# Patient Record
Sex: Male | Born: 1970
Health system: Southern US, Community
[De-identification: ages and names within clinical notes are randomized; demographics above are authoritative.]

## PROBLEM LIST (undated history)

## (undated) DIAGNOSIS — I10 Essential (primary) hypertension: Secondary | ICD-10-CM

## (undated) HISTORY — DX: Essential (primary) hypertension: I10

## (undated) HISTORY — PX: HERNIA REPAIR: SHX51

---
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
## 1998-05-02 ENCOUNTER — Emergency Department (HOSPITAL_COMMUNITY): Admission: EM | Admit: 1998-05-02 | Discharge: 1998-05-02 | Payer: Self-pay | Admitting: Emergency Medicine

## 1998-11-11 ENCOUNTER — Encounter: Payer: Self-pay | Admitting: Emergency Medicine

## 1998-11-11 ENCOUNTER — Emergency Department (HOSPITAL_COMMUNITY): Admission: EM | Admit: 1998-11-11 | Discharge: 1998-11-11 | Payer: Self-pay | Admitting: Emergency Medicine

## 1998-11-12 ENCOUNTER — Encounter: Payer: Self-pay | Admitting: Orthopedic Surgery

## 2001-09-20 HISTORY — PX: INGUINAL HERNIA REPAIR: SHX194

## 2020-01-30 ENCOUNTER — Ambulatory Visit: Payer: Self-pay | Admitting: Family Medicine

## 2020-02-01 ENCOUNTER — Other Ambulatory Visit: Payer: Self-pay

## 2020-02-01 ENCOUNTER — Encounter: Payer: Self-pay | Admitting: Family Medicine

## 2020-02-01 ENCOUNTER — Other Ambulatory Visit: Payer: Self-pay | Admitting: Family Medicine

## 2020-02-01 ENCOUNTER — Ambulatory Visit: Payer: Self-pay | Admitting: Family Medicine

## 2020-02-01 VITALS — BP 162/100 | HR 57 | Temp 97.8°F | Resp 16 | Ht 73.0 in | Wt 216.4 lb

## 2020-02-01 DIAGNOSIS — Z7689 Persons encountering health services in other specified circumstances: Secondary | ICD-10-CM

## 2020-02-01 DIAGNOSIS — R7309 Other abnormal glucose: Secondary | ICD-10-CM

## 2020-02-01 DIAGNOSIS — I10 Essential (primary) hypertension: Secondary | ICD-10-CM

## 2020-02-01 DIAGNOSIS — N529 Male erectile dysfunction, unspecified: Secondary | ICD-10-CM | POA: Insufficient documentation

## 2020-02-01 DIAGNOSIS — Z125 Encounter for screening for malignant neoplasm of prostate: Secondary | ICD-10-CM

## 2020-02-01 MED ORDER — TRIAMTERENE-HCTZ 37.5-25 MG PO TABS: 1.0000 | ORAL_TABLET | Freq: Every day | ORAL | 1 refills | Status: DC

## 2020-02-01 MED ORDER — SILDENAFIL CITRATE 20 MG PO TABS: ORAL_TABLET | ORAL | 2 refills | Status: DC

## 2020-02-01 MED ORDER — AMLODIPINE BESYLATE 10 MG PO TABS: 10.0000 mg | ORAL_TABLET | Freq: Every day | ORAL | 1 refills | Status: DC

## 2020-02-01 NOTE — Patient Instructions (Addendum)
Thank you for coming to the office today.  For Blood Pressure - Go ahead and get a BP cuff, check daily for now, more often if higher readings, eventually can space it out to 2-3 times a week when more comfortable. - Stay seated for 5+ minutes, arm elevated  Limit caffeine, coffee, keep improving exercise cardiovascular as discussed.   Limit water intake  For erection function - Try the Sildenafil 20mg  - dose from 1 pill up to 5 pills max. 1 dose per day, 30 min prior. - If not satisfied we can order the brand name - OR change the type of med such as Cialis if need.   DUE for FASTING BLOOD WORK (no food or drink after midnight before the lab appointment, only water or coffee without cream/sugar on the morning of)  SCHEDULE "Lab Only" visit in the morning at the clinic for lab draw in 2 WEEKS   - Make sure Lab Only appointment is at about 1 week before your next appointment, so that results will be available  For Lab Results, once available within 2-3 days of blood draw, you can can log in to MyChart online to view your results and a brief explanation. Also, we can discuss results at next follow-up visit.   Please schedule a Follow-up Appointment to: Return in about 3 weeks (around 02/22/2020) for Follow-up HTN.  If you have any other questions or concerns, please feel free to call the office or send a message through [place]. You may also schedule an earlier appointment if necessary.  Additionally, you may be receiving a survey about your experience at our office within a few days to 1 week by e-mail or mail. We value your feedback.  Nobie Putnam, DO [place]

## 2020-02-01 NOTE — Assessment & Plan Note (Addendum)
Consistent with likely multifactorial ED - can be BP, medication, history of alcohol, age No history of DM by report on past results - no prior trial on PDE5 inhibitors  Plan: 1. Trial on generic Sildenafil 20mg  tabs, take 2-5 tabs about 30 min prior to sexual activity, refills provided, goodrx 2. Follow-up as needed - he is very interested in brand name and willing to pay if needed, or consider Cialis or other options.

## 2020-02-01 NOTE — Progress Notes (Signed)
Subjective:    Patient ID: Nathan Wade, male    DOB: 03-16-1971, 49 y.o.   MRN: [id number]  Nathan Wade is a 49 y.o. male presenting on 02/01/2020 for Establish Care (ED, HTN)  Establishing care. No insurance currently.  HPI   Additional social/background history Works as a Glass blower/designer History of darker past with alcohol, AA for 7 years off alcohol now doing well. Out of prison 2 weeks ago. Cash pay today. He was on work release and was able keep job community. High stressful situation now leaving prison but he is working on improving. He was having BP monitored once a week recently. Lowest BP in past few months 138/88. Highest 150s/90s  CHRONIC HTN: Reports he has run out of Amlodipine now. He feels overall has some reduced energy with taking BP medications. He has some other side effect with other meds Lisinopril with cough. Had issue with MaxzideHCTZ did well for BP but had low sodium Family history with low BP Current Meds - Olmesartan 40mg  takes half for 20 - has side effect with burning pain in stomach on med then none if off med. Out of Amlodipine today   Reports good compliance, took some meds today Lifestyle: - Diet: balanced, does drink up to 1 gal of water most days - Exercise: his exercise has been limited previously in prison, he was working out but limited activity - Drinks coffee frequently, goal to reduce Denies CP, dyspnea, HA, edema, dizziness / lightheadedness  Erectile Dysfunction: Worsening over past 1 year, initially it was problem with maintaining erection. Now has worsening problem initiating and maintaining. Inadequate erection. Can wake up with partial erection.  Says worse with BP not as well controlled. He has good sexual desire and interest, good sexual attraction to fiancee.  Health Maintenance: Requests annual check up and screening and all labs testing. He is awaiting insurance for further screening.  Depression screen PHQ 2/9 02/01/2020    Decreased Interest 0  Down, Depressed, Hopeless 0  PHQ - 2 Score 0    History reviewed. No pertinent past medical history. Past Surgical History:  Procedure Laterality Date  . BACK SURGERY  1990   Social History   Socioeconomic History  . Marital status: Single    Spouse name: Not on file  . Number of children: Not on file  . Years of education: [employer]  . Highest education level: High school graduate  Occupational History  . Occupation: Glass blower/designer  Tobacco Use  . Smoking status: Never Smoker  . Smokeless tobacco: Never Used  Substance and Sexual Activity  . Alcohol use: Not Currently  . Drug use: Never  . Sexual activity: Not on file  Other Topics Concern  . Not on file  Social History Narrative  . Not on file   Social Determinants of Health   Financial Resource Strain:   . Difficulty of Paying Living Expenses:   Food Insecurity:   . Worried About Charity fundraiser in the Last Year:   . Arboriculturist in the Last Year:   Transportation Needs:   . Film/video editor (Medical):   Marland Kitchen Lack of Transportation (Non-Medical):   Physical Activity:   . Days of Exercise per Week:   . Minutes of Exercise per Session:   Stress:   . Feeling of Stress :   Social Connections:   . Frequency of Communication with Friends and Family:   . Frequency of Social Gatherings with Friends  and Family:   . Attends Religious Services:   . Active Member of Clubs or Organizations:   . Attends Archivist Meetings:   Marland Kitchen Marital Status:   Intimate Partner Violence:   . Fear of Current or Ex-Partner:   . Emotionally Abused:   Marland Kitchen Physically Abused:   . Sexually Abused:    Family History  Problem Relation Age of Onset  . Heart attack Mother    Current Outpatient Medications on File Prior to Visit  Medication Sig  . aspirin EC 81 MG tablet Take 81 mg by mouth daily.  Marland Kitchen atorvastatin (LIPITOR) 10 MG tablet Take 10 mg by mouth daily.   No current  facility-administered medications on file prior to visit.    Review of Systems Per HPI unless specifically indicated above      Objective:    BP (!) 162/100 (BP Location: Left Arm, Cuff Size: Normal)   Pulse (!) 57   Temp 97.8 F (36.6 C) (Temporal)   Resp 16   Ht 6\' 1"  (1.854 m)   Wt 216 lb 6.4 oz (98.2 kg)   BMI 28.55 kg/m   Wt Readings from Last 3 Encounters:  02/01/20 216 lb 6.4 oz (98.2 kg)    Physical Exam Vitals and nursing note reviewed.  Constitutional:      General: He is not in acute distress.    Appearance: He is well-developed. He is not diaphoretic.     Comments: Well-appearing, comfortable, cooperative, muscular build  HENT:     Head: Normocephalic and atraumatic.  Eyes:     General:        Right eye: No discharge.        Left eye: No discharge.     Conjunctiva/sclera: Conjunctivae normal.  Neck:     Thyroid: No thyromegaly.  Cardiovascular:     Rate and Rhythm: Normal rate and regular rhythm.     Heart sounds: Normal heart sounds. No murmur.  Pulmonary:     Effort: Pulmonary effort is normal. No respiratory distress.     Breath sounds: Normal breath sounds. No wheezing or rales.  Musculoskeletal:        General: Normal range of motion.     Cervical back: Normal range of motion and neck supple.  Lymphadenopathy:     Cervical: No cervical adenopathy.  Skin:    General: Skin is warm and dry.     Findings: No erythema or rash.  Neurological:     Mental Status: He is alert and oriented to person, place, and time.  Psychiatric:        Behavior: Behavior normal.     Comments: Well groomed, good eye contact, normal speech and thoughts       No results found for this or any previous visit.    Assessment & Plan:   Problem List Items Addressed This Visit    Essential hypertension - Primary    Moderately elevated BP today on initial reading and manual reading at end of visit Off Amlodipine currently ran out. Mixed results on Benicar some side  effect - Home BP readings limited but now can get cuff and start checking  No known complications - but history of ED related to BP    Plan:  1. Stop Benicar 2. Start back on Maxzide 37.5-25mg  daily (Triamterene, HCTZ) daily, and Amlodipine 10mg  daily - ordered 90 day. Discussed his history of hyponatremia on maxzide - he was drinking >1 gal water per day, advised to reduce  to 40-80 oz per day for now and we will monitor chemistry in 2 weeks 2. Encourage improved lifestyle - low sodium diet, regular exercise, reduce stress, limit caffeine coffee 3. Start monitor BP outside office, bring readings to next visit, if persistently >140/90 or new symptoms notify office sooner 4. Follow-up in 3 weeks       Relevant Medications   atorvastatin (LIPITOR) 10 MG tablet   aspirin EC 81 MG tablet   triamterene-hydrochlorothiazide (MAXZIDE-25) 37.5-25 MG tablet   amLODipine (NORVASC) 10 MG tablet   sildenafil (REVATIO) 20 MG tablet   Erectile dysfunction    Consistent with likely multifactorial ED - can be BP, medication, history of alcohol, age No history of DM by report on past results - no prior trial on PDE5 inhibitors  Plan: 1. Trial on generic Sildenafil 20mg  tabs, take 2-5 tabs about 30 min prior to sexual activity, refills provided, goodrx 2. Follow-up as needed - he is very interested in brand name and willing to pay if needed, or consider Cialis or other options.      Relevant Medications   sildenafil (REVATIO) 20 MG tablet    Other Visit Diagnoses    Encounter to establish care with new doctor          Meds ordered this encounter  Medications  . triamterene-hydrochlorothiazide (MAXZIDE-25) 37.5-25 MG tablet    Sig: Take 1 tablet by mouth daily.    Dispense:  90 tablet    Refill:  1  . amLODipine (NORVASC) 10 MG tablet    Sig: Take 1 tablet (10 mg total) by mouth daily.    Dispense:  90 tablet    Refill:  1  . sildenafil (REVATIO) 20 MG tablet    Sig: Take 1-5 pills about  30 min prior to sex. Start with 1-2 and increase as needed.    Dispense:  100 tablet    Refill:  2      Follow up plan: Return in about 3 weeks (around 02/22/2020) for Follow-up HTN.  Nobie Putnam, [facility] 02/01/2020, 3:25 PM

## 2020-02-01 NOTE — Assessment & Plan Note (Addendum)
Moderately elevated BP today on initial reading and manual reading at end of visit Off Amlodipine currently ran out. Mixed results on Benicar some side effect - Home BP readings limited but now can get cuff and start checking  No known complications - but history of ED related to BP    Plan:  1. Stop Benicar 2. Start back on Maxzide 37.5-25mg  daily (Triamterene, HCTZ) daily, and Amlodipine 10mg  daily - ordered 90 day. Discussed his history of hyponatremia on maxzide - he was drinking >1 gal water per day, advised to reduce to 40-80 oz per day for now and we will monitor chemistry in 2 weeks 2. Encourage improved lifestyle - low sodium diet, regular exercise, reduce stress, limit caffeine coffee 3. Start monitor BP outside office, bring readings to next visit, if persistently >140/90 or new symptoms notify office sooner 4. Follow-up in 3 weeks

## 2020-02-15 ENCOUNTER — Telehealth: Payer: Self-pay

## 2020-02-15 ENCOUNTER — Other Ambulatory Visit: Payer: Self-pay

## 2020-02-15 DIAGNOSIS — N529 Male erectile dysfunction, unspecified: Secondary | ICD-10-CM

## 2020-02-15 DIAGNOSIS — R7309 Other abnormal glucose: Secondary | ICD-10-CM

## 2020-02-15 DIAGNOSIS — E785 Hyperlipidemia, unspecified: Secondary | ICD-10-CM | POA: Diagnosis not present

## 2020-02-15 DIAGNOSIS — I10 Essential (primary) hypertension: Secondary | ICD-10-CM

## 2020-02-15 DIAGNOSIS — Z125 Encounter for screening for malignant neoplasm of prostate: Secondary | ICD-10-CM | POA: Diagnosis not present

## 2020-02-15 MED ORDER — VIAGRA 100 MG PO TABS: 100.0000 mg | ORAL_TABLET | Freq: Every day | ORAL | 0 refills | Status: DC | PRN

## 2020-02-15 NOTE — Telephone Encounter (Signed)
Patient came by to got blood work and requested for to send a message.  He reported that you told him to get back with him if he wanted to try the name brand of Viagra.  He would like this to be sent to pharm in chart.

## 2020-02-15 NOTE — Telephone Encounter (Signed)
PEC transferred the phone call inform the patient as per Dr. Raliegh Ip, he still wants to try out brand Vs generic, aware of price and wants five pills of 100 mg send to the pharmacy.

## 2020-02-15 NOTE — Telephone Encounter (Signed)
Patient called back. Confirmed telephone number. Patient is currently at work with machinery that's why he missed call. Patient stats he will try to be available around 3:30pm to answer the phone to clarify info.

## 2020-02-15 NOTE — Telephone Encounter (Signed)
Left message for patient to call back  

## 2020-02-15 NOTE — Telephone Encounter (Signed)
Attempted to call patient.  We discussed this extensively on 5/14.  He was not convinced the generic medication would work as good as brand name.  I advised him the extremely high cost of brand name and the result should not be much different.  Could you call him back to clarify a few things - I need to know the following information  Brand name Viagra medicine rx  Would he prefer 50mg  or 100mg  tablet  If he takes the 50mg  - he may double the dose up to 2 pills or 100mg  if needed, that is the absolute max dose in 24 hours.  How many pills would he like me to send in?  Typically I would advise 10 pills or less.  10 pills of 50mg  or 100mg  = either one, is going to be $600-700, using a goodrx.com coupon at [place], [facility] 02/15/2020, 11:11 AM

## 2020-02-16 LAB — CBC WITH DIFFERENTIAL/PLATELET
Absolute Monocytes: 524 cells/uL (ref 200–950)
Basophils Absolute: 51 cells/uL (ref 0–200)
Basophils Relative: 1.1 %
Eosinophils Absolute: 299 cells/uL (ref 15–500)
Eosinophils Relative: 6.5 %
HCT: 40.5 % (ref 38.5–50.0)
Hemoglobin: 14 g/dL (ref 13.2–17.1)
Lymphs Abs: 892 cells/uL (ref 850–3900)
MCH: 31.7 pg (ref 27.0–33.0)
MCHC: 34.6 g/dL (ref 32.0–36.0)
MCV: 91.6 fL (ref 80.0–100.0)
MPV: 8.2 fL (ref 7.5–12.5)
Monocytes Relative: 11.4 %
Neutro Abs: 2834 cells/uL (ref 1500–7800)
Neutrophils Relative %: 61.6 %
Platelets: 239 10*3/uL (ref 140–400)
RBC: 4.42 10*6/uL (ref 4.20–5.80)
RDW: 12.4 % (ref 11.0–15.0)
Total Lymphocyte: 19.4 %
WBC: 4.6 10*3/uL (ref 3.8–10.8)

## 2020-02-16 LAB — COMPLETE METABOLIC PANEL WITH GFR
AG Ratio: 2.1 (calc) (ref 1.0–2.5)
ALT: 22 U/L (ref 9–46)
AST: 27 U/L (ref 10–40)
Albumin: 4.5 g/dL (ref 3.6–5.1)
Alkaline phosphatase (APISO): 50 U/L (ref 36–130)
BUN: 20 mg/dL (ref 7–25)
CO2: 24 mmol/L (ref 20–32)
Calcium: 9.1 mg/dL (ref 8.6–10.3)
Chloride: 95 mmol/L — ABNORMAL LOW (ref 98–110)
Creat: 0.96 mg/dL (ref 0.60–1.35)
GFR, Est African American: 107 mL/min/{1.73_m2} (ref >=60)
GFR, Est Non African American: 92 mL/min/{1.73_m2} (ref >=60)
Globulin: 2.1 g/dL (calc) (ref 1.9–3.7)
Glucose, Bld: 97 mg/dL (ref 65–99)
Potassium: 3.7 mmol/L (ref 3.5–5.3)
Sodium: 129 mmol/L — ABNORMAL LOW (ref 135–146)
Total Bilirubin: 0.7 mg/dL (ref 0.2–1.2)
Total Protein: 6.6 g/dL (ref 6.1–8.1)

## 2020-02-16 LAB — HEMOGLOBIN A1C
Hgb A1c MFr Bld: 5.1 % of total Hgb (ref <5.7)
Mean Plasma Glucose: 100 (calc)
eAG (mmol/L): 5.5 (calc)

## 2020-02-16 LAB — LIPID PANEL
Cholesterol: 139 mg/dL (ref <200)
HDL: 66 mg/dL (ref >=40)
LDL Cholesterol (Calc): 59 mg/dL (calc)
Non-HDL Cholesterol (Calc): 73 mg/dL (calc) (ref <130)
Total CHOL/HDL Ratio: 2.1 (calc) (ref <5.0)
Triglycerides: 59 mg/dL (ref <150)

## 2020-02-16 LAB — TSH: TSH: 1.82 mIU/L (ref 0.40–4.50)

## 2020-02-16 LAB — PSA: PSA: 0.9 ng/mL (ref <=4.0)

## 2020-02-22 ENCOUNTER — Other Ambulatory Visit: Payer: Self-pay

## 2020-02-22 ENCOUNTER — Encounter: Payer: Self-pay | Admitting: Family Medicine

## 2020-02-22 ENCOUNTER — Ambulatory Visit (INDEPENDENT_AMBULATORY_CARE_PROVIDER_SITE_OTHER): Payer: BC Managed Care – PPO | Admitting: Family Medicine

## 2020-02-22 VITALS — BP 144/85 | HR 61 | Temp 97.5°F | Resp 16 | Ht 73.0 in | Wt 214.6 lb

## 2020-02-22 DIAGNOSIS — Z1211 Encounter for screening for malignant neoplasm of colon: Secondary | ICD-10-CM

## 2020-02-22 DIAGNOSIS — R7989 Other specified abnormal findings of blood chemistry: Secondary | ICD-10-CM

## 2020-02-22 DIAGNOSIS — E7800 Pure hypercholesterolemia, unspecified: Secondary | ICD-10-CM

## 2020-02-22 DIAGNOSIS — I10 Essential (primary) hypertension: Secondary | ICD-10-CM

## 2020-02-22 DIAGNOSIS — N529 Male erectile dysfunction, unspecified: Secondary | ICD-10-CM

## 2020-02-22 DIAGNOSIS — R252 Cramp and spasm: Secondary | ICD-10-CM | POA: Diagnosis not present

## 2020-02-22 DIAGNOSIS — Z87891 Personal history of nicotine dependence: Secondary | ICD-10-CM

## 2020-02-22 DIAGNOSIS — E871 Hypo-osmolality and hyponatremia: Secondary | ICD-10-CM

## 2020-02-22 DIAGNOSIS — Z7722 Contact with and (suspected) exposure to environmental tobacco smoke (acute) (chronic): Secondary | ICD-10-CM

## 2020-02-22 MED ORDER — TADALAFIL 20 MG PO TABS: 20.0000 mg | ORAL_TABLET | ORAL | 0 refills | Status: DC | PRN

## 2020-02-22 MED ORDER — ATORVASTATIN CALCIUM 10 MG PO TABS: 10.0000 mg | ORAL_TABLET | Freq: Every day | ORAL | 1 refills | Status: DC

## 2020-02-22 NOTE — Patient Instructions (Addendum)
Thank you for coming to the office today.  Leg cramps - Try spoonful of yellow mustard to relieve leg cramps or try daily to prevent the problem  - OTC natural option is Hyland's Leg Cramps (Dissolving tablet) take as needed for muscle cramps  -----------------------------------  Chest X-ray ordered - you can do walk in whenever you are ready - not during lunch hour. mon - fri  Cialis ordered to [place] sent to [facility]  ---------------------------------------------------------------  LAB required to be 8-9am ONLY for testosterone.  DUE for FASTING BLOOD WORK (no food or drink after midnight before the lab appointment, only water or coffee without cream/sugar on the morning of)  SCHEDULE "Lab Only" visit in the morning at the clinic for lab draw in 2 WEEKS    For Lab Results, once available within 2-3 days of blood draw, you can can log in to MyChart online to view your results and a brief explanation. Also, we can discuss results at next follow-up visit.  Colon Cancer Screening: - For all adults age 74+ routine colon cancer screening is highly recommended.     - Recent guidelines from [place] recommend starting age of 76 - Early detection of colon cancer is important, because often there are no warning signs or symptoms, also if found early usually it can be cured. Late stage is hard to treat.  - If you are not interested in Colonoscopy screening (if done and normal you could be cleared for 5 to 10 years until next due), then Cologuard is an excellent alternative for screening test for Colon Cancer. It is highly sensitive for detecting DNA of colon cancer from even the earliest stages. Also, there is NO bowel prep required. - If Cologuard is NEGATIVE, then it is good for 3 years before next due - If Cologuard is POSITIVE, then it is strongly advised to get a Colonoscopy, which allows the GI doctor to locate the source of the cancer or polyp (even very early  stage) and treat it by removing it. ------------------------- If you would like to proceed with Cologuard (stool DNA test) - FIRST, call your insurance company and tell them you want to check cost of Cologuard tell them CPT Code [phone] (it may be completely covered and you could get for no cost, OR max cost without any coverage is about $600). Also, keep in mind if you do NOT open the kit, and decide not to do the test, you will NOT be charged, you should contact the company if you decide not to do the test. - If you want to proceed, you can notify us (phone message, [place], or at next visit) and we will order it for you. The test kit will be delivered to you house within about 1 week. Follow instructions to collect sample, you may call the company for any help or questions, 24/7 telephone support at [phone].   Please schedule a Follow-up Appointment to: Return in about 2 weeks (around 03/07/2020) for lab only AM.  If you have any other questions or concerns, please feel free to call the office or send a message through [place]. You may also schedule an earlier appointment if necessary.  Additionally, you may be receiving a survey about your experience at our office within a few days to 1 week by e-mail or mail. We value your feedback.  Nobie Putnam, DO [place]

## 2020-02-22 NOTE — Progress Notes (Signed)
Subjective:    Patient ID: Nathan Wade, male    DOB: [birth year], 49 y.o.   MRN: [id number]  Nathan Wade is a 49 y.o. male presenting on 02/22/2020 for Hypertension   HPI    CHRONIC HTN: Last visit 01/2020 resumed HTN therapy He is doing well back on Maxzide and Amlodipine. BP controlled or nearly controlled now Last lab did show some Hyponatremia again, he has adjusted his water intake Reports good compliance, took some meds today Lifestyle: - Diet: balanced, drinks a lot of water still, takes a pinch of salt at times if leg cramp - Exercise: improved exercise now cardiovascular and weight training - Drinks coffee frequently, goal to reduce Denies CP, dyspnea, HA, edema, dizziness / lightheadedness  Erectile Dysfunction: Worsening over past 1 year, initially it was problem with maintaining erection. Now has worsening problem initiating and maintaining. Inadequate erection. Can wake up with partial erection.  Says worse with BP not as well controlled. He has good sexual desire and interest, good sexual attraction to fiancee. - Today he says has tried Sildenafil and Viagra brand same results. Would like to try Cialis.  History of Low T Requesting lab test. Prior history, but no results, never on testosterone therapy rx.  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 No prior screening. Currently asymptomatic. No known family history of colon CA. Due for screening test age 67 - will pursue cologuard   Depression screen [facility] 2/9 02/22/2020 02/01/2020  Decreased Interest 0 0  Down, Depressed, Hopeless 0 0  PHQ - 2 Score 0 0    Social History   Tobacco Use  . Smoking status: Former Smoker    Packs/day: 0.50    Years: 4.00    Pack years: 2.00    Types: Cigarettes    Quit date: 2012    Years since quitting: 9.4  . Smokeless tobacco: Never Used  Substance Use Topics  . Alcohol use: Yes  . Drug use: Never    Review of  Systems Per HPI unless specifically indicated above     Objective:    BP (!) 144/85   Pulse 61   Temp (!) 97.5 F (36.4 C) (Temporal)   Resp 16   Ht 6\' 1"  (1.854 m)   Wt 214 lb 9.6 oz (97.3 kg)   BMI 28.31 kg/m   Wt Readings from Last 3 Encounters:  02/22/20 214 lb 9.6 oz (97.3 kg)  02/01/20 216 lb 6.4 oz (98.2 kg)    Physical Exam Vitals and nursing note reviewed.  Constitutional:      General: He is not in acute distress.    Appearance: He is well-developed. He is not diaphoretic.     Comments: Well-appearing, comfortable, cooperative  HENT:     Head: Normocephalic and atraumatic.  Eyes:     General:        Right eye: No discharge.        Left eye: No discharge.     Conjunctiva/sclera: Conjunctivae normal.  Neck:     Thyroid: No thyromegaly.  Cardiovascular:     Rate and Rhythm: Normal rate and regular rhythm.     Heart sounds: Normal heart sounds. No murmur.  Pulmonary:     Effort: Pulmonary effort is normal. No respiratory distress.     Breath sounds: Normal breath sounds. No wheezing or rales.  Musculoskeletal:        General: Normal range of motion.  Cervical back: Normal range of motion and neck supple.  Lymphadenopathy:     Cervical: No cervical adenopathy.  Skin:    General: Skin is warm and dry.     Findings: No erythema or rash.  Neurological:     Mental Status: He is alert and oriented to person, place, and time.  Psychiatric:        Behavior: Behavior normal.     Comments: Well groomed, good eye contact, normal speech and thoughts    Results for orders placed or performed in visit on 02/15/20  TSH  Result Value Ref Range   TSH 1.82 0.40 - 4.50 mIU/L  PSA  Result Value Ref Range   PSA 0.9 < OR = 4.0 ng/mL  Lipid panel  Result Value Ref Range   Cholesterol 139 <200 mg/dL   HDL 66 > OR = 40 mg/dL   Triglycerides 59 <150 mg/dL   LDL Cholesterol (Calc) 59 mg/dL (calc)   Total CHOL/HDL Ratio 2.1 <5.0 (calc)   Non-HDL Cholesterol (Calc) 73  <130 mg/dL (calc)  COMPLETE METABOLIC PANEL WITH GFR  Result Value Ref Range   Glucose, Bld 97 65 - 99 mg/dL   BUN 20 7 - 25 mg/dL   Creat 0.96 0.60 - 1.35 mg/dL   GFR, Est Non African American 92 > OR = 60 mL/min/1.2m2   GFR, Est African American 107 > OR = 60 mL/min/1.56m2   BUN/Creatinine Ratio NOT APPLICABLE 6 - 22 (calc)   Sodium 129 (L) 135 - 146 mmol/L   Potassium 3.7 3.5 - 5.3 mmol/L   Chloride 95 (L) 98 - 110 mmol/L   CO2 24 20 - 32 mmol/L   Calcium 9.1 8.6 - 10.3 mg/dL   Total Protein 6.6 6.1 - 8.1 g/dL   Albumin 4.5 3.6 - 5.1 g/dL   Globulin 2.1 1.9 - 3.7 g/dL (calc)   AG Ratio 2.1 1.0 - 2.5 (calc)   Total Bilirubin 0.7 0.2 - 1.2 mg/dL   Alkaline phosphatase (APISO) 50 36 - 130 U/L   AST 27 10 - 40 U/L   ALT 22 9 - 46 U/L  CBC with Differential/Platelet  Result Value Ref Range   WBC 4.6 3.8 - 10.8 Thousand/uL   RBC 4.42 4.20 - 5.80 Million/uL   Hemoglobin 14.0 13.2 - 17.1 g/dL   HCT 40.5 38.5 - 50.0 %   MCV 91.6 80.0 - 100.0 fL   MCH 31.7 27.0 - 33.0 pg   MCHC 34.6 32.0 - 36.0 g/dL   RDW 12.4 11.0 - 15.0 %   Platelets 239 140 - 400 Thousand/uL   MPV 8.2 7.5 - 12.5 fL   Neutro Abs 2,834 1,500 - 7,800 cells/uL   Lymphs Abs 892 850 - 3,900 cells/uL   Absolute Monocytes 524 200 - 950 cells/uL   Eosinophils Absolute 299 15 - 500 cells/uL   Basophils Absolute 51 0 - 200 cells/uL   Neutrophils Relative % 61.6 %   Total Lymphocyte 19.4 %   Monocytes Relative 11.4 %   Eosinophils Relative 6.5 %   Basophils Relative 1.1 %  Hemoglobin A1c  Result Value Ref Range   Hgb A1c MFr Bld 5.1 <5.7 % of total Hgb   Mean Plasma Glucose 100 (calc)   eAG (mmol/L) 5.5 (calc)      Assessment & Plan:   Problem List Items Addressed This Visit    Essential hypertension - Primary    Improved HTN currently back on meds No known complications - but  history of ED related to BP    Plan:  1. Continue Maxzide 37.5-25mg  daily (Triamterene, HCTZ) daily, and Amlodipine 10mg  daily -  ordered 90 day. 2. Regarding hyponatremia - mild on lab. He should limit excess water intake and continue with appropriate diet as discussed - Repeat chemistry once more within 2 weeks for Na and also Testosterone 3. Encourage improved lifestyle - low sodium diet, regular exercise, reduce stress, limit caffeine coffee 4. monitor BP outside office, bring readings to next visit, if persistently >140/90 or new symptoms notify office sooner        Relevant Medications   tadalafil (CIALIS) 20 MG tablet   atorvastatin (LIPITOR) 10 MG tablet   Other Relevant Orders   DG Chest 2 View   Basic Metabolic Panel (BMET)   Erectile dysfunction    Consistent with likely multifactorial ED - can be BP, medication, history of alcohol, age No history of DM by report on past results - no prior trial on PDE5 inhibitors  Plan: 1. Equal results on generic Sildenafil vs brand Viagra - he does want to try alternative now, request generic Cialis Tadalafil - will order this at 20mg  tab q 48 hr PRN as a trial, f/u with request on which med preferred - need to know which pharmacy and pill count, likely use generic / goodrx discount  Future may consider refer to Urology if indicated      Relevant Medications   tadalafil (CIALIS) 20 MG tablet   Other Relevant Orders   Testosterone    Other Visit Diagnoses    Leg cramps       Exposure to second hand smoke       Relevant Orders   DG Chest 2 View   Former smoker       Relevant Orders   DG Chest 2 View   Pure hypercholesterolemia       Relevant Medications   tadalafil (CIALIS) 20 MG tablet   atorvastatin (LIPITOR) 10 MG tablet   Screening for colon cancer       Relevant Orders   Cologuard   Hyponatremia       Relevant Orders   Basic Metabolic Panel (BMET)   Low testosterone in male       Relevant Orders   Testosterone      Due for routine colon cancer screening. Never had colonoscopy (not interested), no family history colon cancer. - Discussion  today about recommendations for either Colonoscopy or Cologuard screening, benefits and risks of screening, interested in Cologuard, understands that if positive then recommendation is for diagnostic colonoscopy to follow-up. - Ordered Cologuard today  - Patient advised to contact insurance first to learn cost  #2nd hand smoke / cough He requests CXR evaluation given history of former smoker and 2nd hand smoke and he has occasional cough. He will return for walk in CXR once it is ordered.  #History Low T Prior report concern for low T, no lab result available Known ED Will check testosterone in AM in 1-2 weeks, f/u result, discussed benefit risk of testosterone treatment, if ultimately low and indicated for therapy would refer to Urology. Otherwise he may try OTC supplement testosterone boost if interested  Meds ordered this encounter  Medications  . tadalafil (CIALIS) 20 MG tablet    Sig: Take 1 tablet (20 mg total) by mouth every other day as needed for erectile dysfunction.    Dispense:  30 tablet    Refill:  0  . atorvastatin (LIPITOR)  10 MG tablet    Sig: Take 1 tablet (10 mg total) by mouth daily.    Dispense:  90 tablet    Refill:  1      Follow up plan: Return in about 2 weeks (around 03/07/2020) for lab only AM.  Future labs ordered for 2 weeks BMET Testosterone   Nobie Putnam, DO [facility] 02/22/2020, 3:08 PM

## 2020-02-23 NOTE — Assessment & Plan Note (Signed)
Improved HTN currently back on meds No known complications - but history of ED related to BP    Plan:  1. Continue Maxzide 37.5-25mg  daily (Triamterene, HCTZ) daily, and Amlodipine 10mg  daily - ordered 90 day. 2. Regarding hyponatremia - mild on lab. He should limit excess water intake and continue with appropriate diet as discussed - Repeat chemistry once more within 2 weeks for Na and also Testosterone 3. Encourage improved lifestyle - low sodium diet, regular exercise, reduce stress, limit caffeine coffee 4. monitor BP outside office, bring readings to next visit, if persistently >140/90 or new symptoms notify office sooner

## 2020-02-23 NOTE — Assessment & Plan Note (Signed)
Consistent with likely multifactorial ED - can be BP, medication, history of alcohol, age No history of DM by report on past results - no prior trial on PDE5 inhibitors  Plan: 1. Equal results on generic Sildenafil vs brand Viagra - he does want to try alternative now, request generic Cialis Tadalafil - will order this at 20mg  tab q 48 hr PRN as a trial, f/u with request on which med preferred - need to know which pharmacy and pill count, likely use generic / goodrx discount  Future may consider refer to Urology if indicated

## 2020-02-25 ENCOUNTER — Other Ambulatory Visit: Payer: Self-pay

## 2020-02-25 DIAGNOSIS — E7800 Pure hypercholesterolemia, unspecified: Secondary | ICD-10-CM

## 2020-02-25 MED ORDER — ATORVASTATIN CALCIUM 10 MG PO TABS: 10.0000 mg | ORAL_TABLET | Freq: Every day | ORAL | 1 refills | Status: DC

## 2020-02-25 NOTE — Telephone Encounter (Signed)
Patient called and says his Lipitor went to the wrong pharmacy, it was supposed to go to [facility]. Prescription sent to [facility] as requested.

## 2020-03-06 ENCOUNTER — Ambulatory Visit
Admission: RE | Admit: 2020-03-06 | Discharge: 2020-03-06 | Disposition: A | Discharge status: Home / Self Care | Payer: BC Managed Care – PPO | Source: Ambulatory Visit | Attending: Family Medicine | Admitting: Family Medicine

## 2020-03-06 ENCOUNTER — Other Ambulatory Visit: Payer: BC Managed Care – PPO

## 2020-03-06 ENCOUNTER — Other Ambulatory Visit: Payer: Self-pay

## 2020-03-06 DIAGNOSIS — Z87891 Personal history of nicotine dependence: Secondary | ICD-10-CM | POA: Insufficient documentation

## 2020-03-06 DIAGNOSIS — F17200 Nicotine dependence, unspecified, uncomplicated: Secondary | ICD-10-CM | POA: Diagnosis not present

## 2020-03-06 DIAGNOSIS — E291 Testicular hypofunction: Secondary | ICD-10-CM | POA: Diagnosis not present

## 2020-03-06 DIAGNOSIS — N529 Male erectile dysfunction, unspecified: Secondary | ICD-10-CM

## 2020-03-06 DIAGNOSIS — Z7722 Contact with and (suspected) exposure to environmental tobacco smoke (acute) (chronic): Secondary | ICD-10-CM | POA: Diagnosis not present

## 2020-03-06 DIAGNOSIS — J984 Other disorders of lung: Secondary | ICD-10-CM | POA: Diagnosis not present

## 2020-03-06 DIAGNOSIS — R05 Cough: Secondary | ICD-10-CM | POA: Diagnosis not present

## 2020-03-06 DIAGNOSIS — Z1211 Encounter for screening for malignant neoplasm of colon: Secondary | ICD-10-CM

## 2020-03-06 DIAGNOSIS — R7989 Other specified abnormal findings of blood chemistry: Secondary | ICD-10-CM

## 2020-03-06 DIAGNOSIS — E871 Hypo-osmolality and hyponatremia: Secondary | ICD-10-CM

## 2020-03-06 DIAGNOSIS — I10 Essential (primary) hypertension: Secondary | ICD-10-CM

## 2020-03-06 LAB — BASIC METABOLIC PANEL
BUN: 23 mg/dL (ref 7–25)
CO2: 30 mmol/L (ref 20–32)
Calcium: 9.7 mg/dL (ref 8.6–10.3)
Chloride: 94 mmol/L — ABNORMAL LOW (ref 98–110)
Creat: 0.94 mg/dL (ref 0.60–1.35)
Glucose, Bld: 99 mg/dL (ref 65–99)
Potassium: 3.8 mmol/L (ref 3.5–5.3)
Sodium: 132 mmol/L — ABNORMAL LOW (ref 135–146)

## 2020-03-06 LAB — TESTOSTERONE: Testosterone: 261 ng/dL (ref 250–827)

## 2020-03-25 ENCOUNTER — Ambulatory Visit (INDEPENDENT_AMBULATORY_CARE_PROVIDER_SITE_OTHER): Payer: BC Managed Care – PPO | Admitting: Family Medicine

## 2020-03-25 ENCOUNTER — Other Ambulatory Visit: Payer: Self-pay

## 2020-03-25 ENCOUNTER — Encounter: Payer: Self-pay | Admitting: Family Medicine

## 2020-03-25 VITALS — BP 123/79 | HR 68 | Temp 97.3°F | Resp 16 | Ht 73.0 in | Wt 216.0 lb

## 2020-03-25 DIAGNOSIS — R222 Localized swelling, mass and lump, trunk: Secondary | ICD-10-CM

## 2020-03-25 DIAGNOSIS — J0110 Acute frontal sinusitis, unspecified: Secondary | ICD-10-CM | POA: Diagnosis not present

## 2020-03-25 DIAGNOSIS — K4090 Unilateral inguinal hernia, without obstruction or gangrene, not specified as recurrent: Secondary | ICD-10-CM | POA: Diagnosis not present

## 2020-03-25 MED ORDER — IPRATROPIUM BROMIDE 0.06 % NA SOLN: 2.0000 | Freq: Four times a day (QID) | NASAL | 0 refills | Status: DC

## 2020-03-25 MED ORDER — FLUTICASONE PROPIONATE 50 MCG/ACT NA SUSP: 2.0000 | Freq: Every day | NASAL | 3 refills | Status: DC

## 2020-03-25 MED ORDER — AMOXICILLIN-POT CLAVULANATE 875-125 MG PO TABS: 1.0000 | ORAL_TABLET | Freq: Two times a day (BID) | ORAL | 0 refills | Status: DC

## 2020-03-25 NOTE — Progress Notes (Signed)
Subjective:    Patient ID: Nathan Wade, male    DOB: [birth year], 49 y.o.   MRN: [id number]  Nathan Wade is a 49 y.o. male presenting on 03/25/2020 for Hernia   HPI   Inguinal Hernia, Left Prior history R inguinal, hernia repair in 2002 or 03 in [place], open hernia repair has done well with mesh. Now having worsening symptoms hernia on left inguinal region, gradual problem only more pronounced worsening lately over past few weeks or longer, has increased frequency of hernia bugling and fullness and moving, says it comes out after standing for a few minutes of being active ,then it usually doesn't go back in until end of day if resting. It is not painful but uncomfortable at times. Denies any worsening abdominal pain, nausea vomiting, constipation, cough straining  Additional complaint Knot of Right mid abdomen He feels a lump there more superficial and he can move it with his fingers about an inch approx, seems increasing in size, non tender  Sinusitis chronic sinus congestion, clearing nasal, few times a year OTC sinus decongestant raises his BP Tried OTC Nasocort limited results  ED - cialis 20mg  PRN - successful taking q 2 days if needed   Depression screen [facility] 2/9 02/22/2020 02/01/2020  Decreased Interest 0 0  Down, Depressed, Hopeless 0 0  PHQ - 2 Score 0 0   Past Surgical History:  Procedure Laterality Date  . BACK SURGERY  1990  . INGUINAL HERNIA REPAIR Right 2003   open repair, with mesh      Social History   Tobacco Use  . Smoking status: Former Smoker    Packs/day: 0.50    Years: 4.00    Pack years: 2.00    Types: Cigarettes    Quit date: 2012    Years since quitting: 9.5  . Smokeless tobacco: Never Used  Substance Use Topics  . Alcohol use: Yes  . Drug use: Never    Review of Systems Per HPI unless specifically indicated above     Objective:    BP 123/79   Pulse 68   Temp (!) 97.3 F (36.3 C) (Temporal)   Resp 16   Ht 6\' 1"  (1.854 m)    Wt 216 lb (98 kg)   SpO2 98%   BMI 28.50 kg/m   Wt Readings from Last 3 Encounters:  03/25/20 216 lb (98 kg)  02/22/20 214 lb 9.6 oz (97.3 kg)  02/01/20 216 lb 6.4 oz (98.2 kg)    Physical Exam Vitals and nursing note reviewed.  Constitutional:      General: He is not in acute distress.    Appearance: He is well-developed. He is not diaphoretic.     Comments: Well-appearing, comfortable, cooperative  HENT:     Head: Normocephalic and atraumatic.  Eyes:     General:        Right eye: No discharge.        Left eye: No discharge.     Conjunctiva/sclera: Conjunctivae normal.  Cardiovascular:     Rate and Rhythm: Normal rate.  Pulmonary:     Effort: Pulmonary effort is normal.  Abdominal:     Comments: Left inguinal hernia with provoked herniation on valsalva, non tender.  Right abdomen mid - approach 2 x 1 cm nodular density firm mobile seems more subcutaneous.  Skin:    General: Skin is warm and dry.     Findings: No erythema or rash.  Neurological:     Mental Status:  He is alert and oriented to person, place, and time.  Psychiatric:        Behavior: Behavior normal.     Comments: Well groomed, good eye contact, normal speech and thoughts       Results for orders placed or performed in visit on 03/06/20  Testosterone  Result Value Ref Range   Testosterone 261 250 - 827 ng/dL  Basic Metabolic Panel (BMET)  Result Value Ref Range   Glucose, Bld 99 65 - 99 mg/dL   BUN 23 7 - 25 mg/dL   Creat 0.94 0.60 - 1.35 mg/dL   BUN/Creatinine Ratio NOT APPLICABLE 6 - 22 (calc)   Sodium 132 (L) 135 - 146 mmol/L   Potassium 3.8 3.5 - 5.3 mmol/L   Chloride 94 (L) 98 - 110 mmol/L   CO2 30 20 - 32 mmol/L   Calcium 9.7 8.6 - 10.3 mg/dL      Assessment & Plan:   Problem List Items Addressed This Visit    None    Visit Diagnoses    Left inguinal hernia    -  Primary   Relevant Orders   Ambulatory referral to General Surgery   Subcutaneous nodule of abdominal wall        Relevant Orders   Ambulatory referral to General Surgery   Acute non-recurrent frontal sinusitis       Relevant Medications   loratadine (CLARITIN) 10 MG tablet   amoxicillin-clavulanate (AUGMENTIN) 875-125 MG tablet   ipratropium (ATROVENT) 0.06 % nasal spray   fluticasone (FLONASE) 50 MCG/ACT nasal spray      #L inguinal hernia Consistent with acute on chronic Left inguinal hernia - Seems to be gradually worsening and is reducible without evidence of incarceration. Additionally does not extend into scrotum. - No acute red flag symptoms (without nausea vomiting, bowel obstruction, systemic illness, uncontrolled pain) - History of prior R nguinal hernia repair open incision / mesh repair about 20 years ago  Plan: 1. Referral to General Surgery today, goal to have consultation and anticipate future scheduling surgical repair after his upcoming wedding 04/26/20 2. In meantime - recommend conservative therapy limit provoking activities and avoid heavy lifting, try to rest supine and trendelenburg if bulging, may use topical ice packs, hernia support truss / strap as needed or more regularly if worsening, can use Tylenol PRN 3. Strict return criteria and when to go to hospital ED for more acute evaluation if any significant worsening, constant pain, systemic symptoms, or potential incarceration bulge that does not reduce 4. Follow-up as needed  #Abdominal nodule Referral to Gen Surg as well and they can evaluate this area, seems possible lipoma or subcutaneous nodule.   #Acute sinusitis Clinically with persistent sinus infection >10 days Trial on Augmentin BID 7 day Start Atrovent nasal spray decongestant 2 sprays in each nostril up to 4 times daily for 7 days Start nasal steroid Flonase 2 sprays in each nostril daily for 4-6 weeks, may repeat course seasonally or as needed May continue Claritin  Orders Placed This Encounter  Procedures  . Ambulatory referral to General Surgery     Referral Priority:   Routine    Referral Type:   Surgical    Referral Reason:   Specialty Services Required    Requested Specialty:   General Surgery    Number of Visits Requested:   1     Meds ordered this encounter  Medications  . amoxicillin-clavulanate (AUGMENTIN) 875-125 MG tablet    Sig: Take 1 tablet  by mouth 2 (two) times daily. For 7 days    Dispense:  14 tablet    Refill:  0  . ipratropium (ATROVENT) 0.06 % nasal spray    Sig: Place 2 sprays into both nostrils 4 (four) times daily. For up to 5-7 days then stop.    Dispense:  15 mL    Refill:  0  . fluticasone (FLONASE) 50 MCG/ACT nasal spray    Sig: Place 2 sprays into both nostrils daily. Use for 4-6 weeks then stop and use seasonally or as needed.    Dispense:  16 g    Refill:  3      Follow up plan: Return if symptoms worsen or fail to improve, for hernia.    Nobie Putnam, [facility] 03/25/2020, 4:23 PM

## 2020-03-25 NOTE — Patient Instructions (Addendum)
Thank you for coming to the office today.  For sinuses - go ahead and use Augmentin for 7 days. Can stop OTC decongestant. May continue Claritin longer term.  Start nasal steroid Flonase 2 sprays in each nostril daily for 4-6 weeks, may repeat course seasonally or as needed  Short term only - Start Atrovent nasal spray decongestant 2 sprays in each nostril up to 4 times daily for 7 days  Referral placed - stay tuned. They should call and schedule.  [facility] [address] [place],  [place]  [address] Main: [phone]   Please schedule a Follow-up Appointment to: Return if symptoms worsen or fail to improve, for hernia.  If you have any other questions or concerns, please feel free to call the office or send a message through [facility]. You may also schedule an earlier appointment if necessary.  Additionally, you may be receiving a survey about your experience at our office within a few days to 1 week by e-mail or mail. We value your feedback.  Nobie Putnam, DO [place]

## 2020-03-26 ENCOUNTER — Other Ambulatory Visit: Payer: Self-pay | Admitting: Family Medicine

## 2020-03-26 DIAGNOSIS — J0110 Acute frontal sinusitis, unspecified: Secondary | ICD-10-CM

## 2020-03-26 MED ORDER — AMOXICILLIN-POT CLAVULANATE 875-125 MG PO TABS: 1.0000 | ORAL_TABLET | Freq: Two times a day (BID) | ORAL | 0 refills | Status: DC

## 2020-03-26 MED ORDER — IPRATROPIUM BROMIDE 0.06 % NA SOLN: 2.0000 | Freq: Four times a day (QID) | NASAL | 0 refills | Status: DC

## 2020-03-26 MED ORDER — FLUTICASONE PROPIONATE 50 MCG/ACT NA SUSP: 2.0000 | Freq: Every day | NASAL | 3 refills | Status: DC

## 2020-03-26 NOTE — Telephone Encounter (Signed)
Patient states he spoke with pharmacy and pharmacy does not have medication refill. Patient is asking for office to send again

## 2020-03-26 NOTE — Telephone Encounter (Signed)
Signed orders to [place] 03/26/2020, 4:59 PM

## 2020-03-26 NOTE — Addendum Note (Signed)
Addended by: Olin Hauser on: 03/26/2020 04:59 PM   Modules accepted: Orders

## 2020-03-26 NOTE — Telephone Encounter (Signed)
Pt needs his prescriptions sent to  [facility] [place], [place] Phone:  [phone]  Fax:  [phone]     His insurance will not cover at [facility]  ipratropium (ATROVENT) 0.06 % nasal spray  amoxicillin-clavulanate (AUGMENTIN) 875-125 MG tablet

## 2020-03-26 NOTE — Addendum Note (Signed)
Addended by: Frederich Cha D on: 03/26/2020 04:59 PM   Modules accepted: Orders

## 2020-05-05 ENCOUNTER — Other Ambulatory Visit: Payer: Self-pay

## 2020-05-05 DIAGNOSIS — N529 Male erectile dysfunction, unspecified: Secondary | ICD-10-CM

## 2020-05-06 MED ORDER — TADALAFIL 20 MG PO TABS: 20.0000 mg | ORAL_TABLET | ORAL | 3 refills | Status: DC | PRN

## 2020-05-13 ENCOUNTER — Other Ambulatory Visit: Payer: Self-pay

## 2020-05-13 ENCOUNTER — Encounter: Payer: Self-pay | Admitting: General Surgery

## 2020-05-13 ENCOUNTER — Ambulatory Visit (INDEPENDENT_AMBULATORY_CARE_PROVIDER_SITE_OTHER): Payer: BC Managed Care – PPO | Admitting: General Surgery

## 2020-05-13 VITALS — BP 157/87 | HR 57 | Temp 98.4°F | Resp 12 | Ht 73.0 in | Wt 212.0 lb

## 2020-05-13 DIAGNOSIS — K4090 Unilateral inguinal hernia, without obstruction or gangrene, not specified as recurrent: Secondary | ICD-10-CM | POA: Diagnosis not present

## 2020-05-13 NOTE — Progress Notes (Signed)
Patient ID: Nathan Wade, male   DOB: 03/19/1971, 49 y.o.   MRN: [id number]  Chief Complaint  Patient presents with  . New Patient (Initial Visit)    Left inguinal hernia and abdominal wall mass    HPI Nathan Wade is a 49 y.o. male.  He has been referred by his primary care provider, Dr. Parks Ranger, for evaluation of a left inguinal hernia and a small right-sided abdominal mass.  He states that he first noticed the hernia about 10 to 12 months ago.  It previously did not bulge much, but over the ensuing time, he has noticed an increase in the bulging area.  He notices it most when he is lifting weights or if he coughs.  He says that when he first noticed it, there was a burning sensation in his right groin, that has since dissipated.  He now notices an occasional tingling sensation in that area when the hernia is particularly prominent.  He denies any nausea or vomiting.  No alterations in bowel function or difficulty with urination.  He says that it is always reducible.  He does have a history of right inguinal hernia repair in the past.  He says that this repair has been durable and he has no symptoms on that side.  He says that the small mass on his right abdomen does not really bother him, but he thinks that it might be growing.  He also mentioned that when he does sit ups, he notices a bulging in his mid abdomen that is vertical, without pain, and only present when he performs this exercise.   Past Medical History:  Diagnosis Date  . Hypertension     Past Surgical History:  Procedure Laterality Date  . BACK SURGERY  1990  . INGUINAL HERNIA REPAIR Right 2003   open repair, with mesh    Family History  Problem Relation Age of Onset  . Heart attack Mother     Social History Social History   Tobacco Use  . Smoking status: Former Smoker    Packs/day: 0.50    Years: 4.00    Pack years: 2.00    Types: Cigarettes    Quit date: 2012    Years since quitting: 9.6  . Smokeless  tobacco: Never Used  Substance Use Topics  . Alcohol use: Yes  . Drug use: Never    No Known Allergies  Current Outpatient Medications  Medication Sig Dispense Refill  . amLODipine (NORVASC) 10 MG tablet Take 1 tablet (10 mg total) by mouth daily. 90 tablet 1  . atorvastatin (LIPITOR) 10 MG tablet Take 1 tablet (10 mg total) by mouth daily. 90 tablet 1  . sildenafil (REVATIO) 20 MG tablet Take 1-5 pills about 30 min prior to sex. Start with 1-2 and increase as needed. 100 tablet 2  . tadalafil (CIALIS) 20 MG tablet Take 1 tablet (20 mg total) by mouth every other day as needed for erectile dysfunction. 30 tablet 3  . triamterene-hydrochlorothiazide (MAXZIDE-25) 37.5-25 MG tablet Take 1 tablet by mouth daily. 90 tablet 1  . aspirin EC 81 MG tablet Take 81 mg by mouth daily. (Patient not taking: Reported on 05/13/2020)     No current facility-administered medications for this visit.    Review of Systems Review of Systems  All other systems reviewed and are negative. Or as discussed in the history of present illness  Blood pressure (!) 157/87, pulse (!) 57, temperature 98.4 F (36.9 C), resp. rate 12, height  6\' 1"  (1.854 m), weight 212 lb (96.2 kg), SpO2 97 %. Body mass index is 27.97 kg/m.  Physical Exam Physical Exam Constitutional:      General: He is not in acute distress.    Appearance: Normal appearance.  HENT:     Head: Normocephalic and atraumatic.     Nose:     Comments: Covered with a mask    Mouth/Throat:     Comments: Covered with a mask Eyes:     General: No scleral icterus.       Right eye: No discharge.        Left eye: No discharge.  Neck:     Comments: There is no palpable cervical or supraclavicular lymphadenopathy.  The trachea is midline.  There are no dominant masses or thyromegaly appreciated.  The gland moves freely with deglutition. Cardiovascular:     Rate and Rhythm: Normal rate and regular rhythm.     Pulses: Normal pulses.  Pulmonary:      Effort: Pulmonary effort is normal.     Breath sounds: Normal breath sounds.  Abdominal:     Palpations: Abdomen is soft.       Comments: Diastasis rectus is present.  There is a soft, oblong, roughly 1.5 cm subcutaneous mass consistent with a lipoma..  Genitourinary:      Comments: There is a small reducible left inguinal hernia.  He has a scar on the right from his prior open hernia repair. Musculoskeletal:        General: No swelling or tenderness.  Skin:    General: Skin is warm and dry.     Comments: Multiple tattoos  Neurological:     General: No focal deficit present.     Mental Status: He is alert and oriented to person, place, and time.  Psychiatric:        Mood and Affect: Mood normal.        Behavior: Behavior normal.     Data Reviewed I reviewed Dr. Christella Scheuermann recent clinic note of March 25, 2020 wherein he documented the presence of the small subcutaneous mass as well as left inguinal hernia and made the referral to general surgery.  Assessment This is a 49 year old man with an increasingly symptomatic left inguinal hernia.  He would like to proceed with repair.  The small subcutaneous lipoma is not bothering him and he would like to continue to simply observe.  The mid abdominal bulging that he notices with sit ups is diastasis rectus and does not require surgical intervention.   Plan I have explained the procedure, risks, and aftercare of inguinal hernia repair to Nathan Wade.   Risks include but are not limited to bleeding, infection, wound problems, anesthesia, recurrence, bladder or intestine injury, urinary retention, testicular dysfunction, chronic pain, mesh problems.  He  seems to understand and agrees to proceed.  Questions were answered to his stated satisfaction.  We will work on getting him scheduled.  He would like to wait until the end of October, due to recently having taken vacation from work for his wedding.    Fredirick Maudlin 05/13/2020, 3:29  PM

## 2020-05-13 NOTE — Patient Instructions (Signed)
Our surgery scheduler will contact you to schedule your surgery. Please have the blue sheet available when she calls you.   Be aware that you will not be able to lift or push anything heavier than 10 pounds for 6 weeks.   You will need to be off of your Aspirin 1 week prior to surgery.   Call the office if you have any questions or concerns.   Inguinal Hernia, Adult Muscles help keep everything in the body in its proper place. But if a weak spot in the muscles develops, something can poke through. That is called a hernia. When this happens in the lower part of the belly (abdomen), it is called an inguinal hernia. (It takes its name from a part of the body in this region called the inguinal canal.) A weak spot in the wall of muscles lets some fat or part of the small intestine bulge through. An inguinal hernia can develop at any age. Men get them more often than women. CAUSES  In adults, an inguinal hernia develops over time.  It can be triggered by:  Suddenly straining the muscles of the lower abdomen.  Lifting heavy objects.  Straining to have a bowel movement. Difficult bowel movements (constipation) can lead to this.  Constant coughing. This may be caused by smoking or lung disease.  Being overweight.  Being pregnant.  Working at a job that requires long periods of standing or heavy lifting.  Having had an inguinal hernia before. One type can be an emergency situation. It is called a strangulated inguinal hernia. It develops if part of the small intestine slips through the weak spot and cannot get back into the abdomen. The blood supply can be cut off. If that happens, part of the intestine may die. This situation requires emergency surgery. SYMPTOMS  Often, a small inguinal hernia has no symptoms. It is found when a healthcare provider does a physical exam. Larger hernias usually have symptoms.   In adults, symptoms may include:  A lump in the groin. This is easier to see when  the person is standing. It might disappear when lying down.  In men, a lump in the scrotum.  Pain or burning in the groin. This occurs especially when lifting, straining or coughing.  A dull ache or feeling of pressure in the groin.  Signs of a strangulated hernia can include:  A bulge in the groin that becomes very painful and tender to the touch.  A bulge that turns red or purple.  Fever, nausea and vomiting.  Inability to have a bowel movement or to pass gas. DIAGNOSIS  To decide if you have an inguinal hernia, a healthcare provider will probably do a physical examination.  This will include asking questions about any symptoms you have noticed.  The healthcare provider might feel the groin area and ask you to cough. If an inguinal hernia is felt, the healthcare provider may try to slide it back into the abdomen.  Usually no other tests are needed. TREATMENT  Treatments can vary. The size of the hernia makes a difference. Options include:  Watchful waiting. This is often suggested if the hernia is small and you have had no symptoms.  No medical procedure will be done unless symptoms develop.  You will need to watch closely for symptoms. If any occur, contact your healthcare provider right away.  Surgery. This is used if the hernia is larger or you have symptoms.  Open surgery. This is usually an outpatient  procedure (you will not stay overnight in a hospital). An cut (incision) is made through the skin in the groin. The hernia is put back inside the abdomen. The weak area in the muscles is then repaired by herniorrhaphy or hernioplasty. Herniorrhaphy: in this type of surgery, the weak muscles are sewn back together. Hernioplasty: a patch or mesh is used to close the weak area in the abdominal wall.  Laparoscopy. In this procedure, a surgeon makes small incisions. A thin tube with a tiny video camera (called a laparoscope) is put into the abdomen. The surgeon repairs the hernia  with mesh by looking with the video camera and using two long instruments. HOME CARE INSTRUCTIONS   After surgery to repair an inguinal hernia:  You will need to take pain medicine prescribed by your healthcare provider. Follow all directions carefully.  You will need to take care of the wound from the incision.  Your activity will be restricted for awhile. This will probably include no heavy lifting for several weeks. You also should not do anything too active for a few weeks. When you can return to work will depend on the type of job that you have.  During "watchful waiting" periods, you should:  Maintain a healthy weight.  Eat a diet high in fiber (fruits, vegetables and whole grains).  Drink plenty of fluids to avoid constipation. This means drinking enough water and other liquids to keep your urine clear or pale yellow.  Do not lift heavy objects.  Do not stand for long periods of time.  Quit smoking. This should keep you from developing a frequent cough. SEEK MEDICAL CARE IF:   A bulge develops in your groin area.  You feel pain, a burning sensation or pressure in the groin. This might be worse if you are lifting or straining.  You develop a fever of more than 100.5 F (38.1 C). SEEK IMMEDIATE MEDICAL CARE IF:   Pain in the groin increases suddenly.  A bulge in the groin gets bigger suddenly and does not go down.  For men, there is sudden pain in the scrotum. Or, the size of the scrotum increases.  A bulge in the groin area becomes red or purple and is painful to touch.  You have nausea or vomiting that does not go away.  You feel your heart beating much faster than normal.  You cannot have a bowel movement or pass gas.  You develop a fever of more than 102.0 F (38.9 C).   This information is not intended to replace advice given to you by your health care provider. Make sure you discuss any questions you have with your health care provider.   Document  Released: 01/23/2009 Document Revised: 11/29/2011 Document Reviewed: 03/10/2015 Elsevier Interactive Patient Education [employer].

## 2020-05-14 ENCOUNTER — Telehealth: Payer: Self-pay | Admitting: General Surgery

## 2020-05-14 NOTE — Telephone Encounter (Signed)
Spoke with patient this morning regarding scheduling surgery.  Per patient he wants to hold off for now.  Wants to wait for the end of October.  He wishes to schedule for a Friday October 29th 2021.  At this time the schedule is not available.  Once available I will schedule for the above date at patient's request.  Will also need to check with Dr. Celine Ahr to see if follow up will be needed again prior to surgery since will be over two months of seeing her in the office.

## 2020-05-19 ENCOUNTER — Telehealth: Payer: Self-pay | Admitting: General Surgery

## 2020-05-19 NOTE — Telephone Encounter (Signed)
Outgoing call is made.  Left message to call.  Please advise patient of Pre-Admission date/time, COVID Testing date and Surgery date.  Surgery Date: 07/18/20 Preadmission Testing Date: 07/09/20 (phone 1p to 5 p) Covid Testing Date: 07/16/20 - patient advised to go to the [facility] ([place]) between 8a-1p   Also patient needs to call [phone], between 1-3:00pm the day before surgery, to find out what time to arrive for surgery.

## 2020-05-27 NOTE — Telephone Encounter (Signed)
Outgoing call is made again.  Patient is now aware of all dates regarding his surgery and voices understanding.

## 2020-06-18 ENCOUNTER — Telehealth: Payer: Self-pay | Admitting: General Surgery

## 2020-06-18 NOTE — Telephone Encounter (Signed)
Updated information regarding rescheduled surgery.  Patient has been advised of Pre-Admission date/time, COVID Testing date and Surgery date.  Surgery Date: 07/09/20 Preadmission Testing Date: 06/30/20 (phone 1p-5p) Covid Testing Date: 07/07/20 - patient advised to go to the [facility] ([place]) between 8a-1p  Patient has been made aware to call [phone], between 1-3:00pm the day before surgery, to find out what time to arrive for surgery.

## 2020-06-23 ENCOUNTER — Encounter: Payer: Self-pay | Admitting: Family Medicine

## 2020-06-23 ENCOUNTER — Other Ambulatory Visit: Payer: Self-pay

## 2020-06-23 ENCOUNTER — Ambulatory Visit (INDEPENDENT_AMBULATORY_CARE_PROVIDER_SITE_OTHER): Payer: BC Managed Care – PPO | Admitting: Family Medicine

## 2020-06-23 VITALS — BP 152/83 | HR 59 | Temp 97.7°F | Resp 16 | Ht 73.0 in | Wt 215.8 lb

## 2020-06-23 DIAGNOSIS — K4090 Unilateral inguinal hernia, without obstruction or gangrene, not specified as recurrent: Secondary | ICD-10-CM | POA: Diagnosis not present

## 2020-06-23 DIAGNOSIS — J321 Chronic frontal sinusitis: Secondary | ICD-10-CM

## 2020-06-23 MED ORDER — AMOXICILLIN-POT CLAVULANATE 875-125 MG PO TABS: 1.0000 | ORAL_TABLET | Freq: Two times a day (BID) | ORAL | 0 refills | Status: DC

## 2020-06-23 MED ORDER — NAPROXEN 500 MG PO TABS: 500.0000 mg | ORAL_TABLET | Freq: Two times a day (BID) | ORAL | 0 refills | Status: DC

## 2020-06-23 NOTE — Patient Instructions (Addendum)
Thank you for coming to the office today.  [address]  [place], [place] [address] Ph: [phone]  Stay tuned for referral to ENT specialist.  Start Augmentin antibiotic - Use existing nasal sprays as needed  -------------------  For pain from hernia  Recommend trial of Anti-inflammatory with Naproxen (Naprosyn) 500mg  tabs - take one with food and plenty of water TWICE daily every day (breakfast and dinner), for next 2 to 4 weeks, then you may take only as needed - DO NOT TAKE any ibuprofen, aleve, motrin while you are taking this medicine - It is safe to take Tylenol Ext Str 500mg  tabs - take 1 to 2 (max dose 1000mg ) every 6 hours as needed for breakthrough pain, max 24 hour daily dose is 6 to 8 tablets or 4000mg   If need something else rx - can offer Gabapentin nerve vs central pain med, then if anything beyond that, I would suggest calling Dr Celine Ahr   Please schedule a Follow-up Appointment to: Return if symptoms worsen or fail to improve.  If you have any other questions or concerns, please feel free to call the office or send a message through [place]. You may also schedule an earlier appointment if necessary.  Additionally, you may be receiving a survey about your experience at our office within a few days to 1 week by e-mail or mail. We value your feedback.  Nobie Putnam, DO [place]

## 2020-06-23 NOTE — Progress Notes (Signed)
Subjective:    Patient ID: Nathan Wade, male    DOB: Dec 31, 1970, 49 y.o.   MRN: [id number]  Nathan Wade is a 49 y.o. male presenting on 06/23/2020 for Sinusitis and Hernia (having surgery on 07/09/2020 but need something for pain)   HPI   Chronic recurrent sinusitis History of recurrent sinusitis problems for years, since approx 89, often has 3-4 sinus infections per year, not seasonal. Last sinusitis 03/25/20 treated with augmentin and Flonase and Atrovent nasal PRN Currently with recurrence of symptoms, he has delayed this by 3-4 weeks Admits productive yellow drainage Denies any fevers chills, sweats, cough  Still has nasal spray  Additional topics Upcoming L inguinal hernia - scheduled with Dr Fredirick Maudlin 07/09/20 - admits some aching throbbing, not having any increased swelling or new changes - asks for pain relief  Health Maintenance: UTD COVID vaccine Due for Flu shot, will defer due to sinusitis  Depression screen [facility] 2/9 02/22/2020 02/01/2020  Decreased Interest 0 0  Down, Depressed, Hopeless 0 0  PHQ - 2 Score 0 0    Social History   Tobacco Use  . Smoking status: Former Smoker    Packs/day: 0.50    Years: 4.00    Pack years: 2.00    Types: Cigarettes    Quit date: 2012    Years since quitting: 9.7  . Smokeless tobacco: Never Used  Substance Use Topics  . Alcohol use: Yes  . Drug use: Never    Review of Systems Per HPI unless specifically indicated above     Objective:    BP (!) 152/83   Pulse (!) 59   Temp 97.7 F (36.5 C) (Temporal)   Resp 16   Ht 6\' 1"  (1.854 m)   Wt 215 lb 12.8 oz (97.9 kg)   SpO2 99%   BMI 28.47 kg/m   Wt Readings from Last 3 Encounters:  06/23/20 215 lb 12.8 oz (97.9 kg)  05/13/20 212 lb (96.2 kg)  03/25/20 216 lb (98 kg)    Physical Exam Vitals and nursing note reviewed.  Constitutional:      General: He is not in acute distress.    Appearance: He is well-developed. He is not diaphoretic.     Comments:  Well-appearing, comfortable, cooperative  HENT:     Head: Normocephalic and atraumatic.  Eyes:     General:        Right eye: No discharge.        Left eye: No discharge.     Conjunctiva/sclera: Conjunctivae normal.  Cardiovascular:     Rate and Rhythm: Normal rate.  Pulmonary:     Effort: Pulmonary effort is normal.  Genitourinary:    Comments: Declined repeat hernia exam today, denied any change. Skin:    General: Skin is warm and dry.     Findings: No erythema or rash.  Neurological:     Mental Status: He is alert and oriented to person, place, and time.  Psychiatric:        Behavior: Behavior normal.     Comments: Well groomed, good eye contact, normal speech and thoughts    Results for orders placed or performed in visit on 03/06/20  Testosterone  Result Value Ref Range   Testosterone 261 250 - 827 ng/dL  Basic Metabolic Panel (BMET)  Result Value Ref Range   Glucose, Bld 99 65 - 99 mg/dL   BUN 23 7 - 25 mg/dL   Creat 0.94 0.60 - 1.35 mg/dL  BUN/Creatinine Ratio NOT APPLICABLE 6 - 22 (calc)   Sodium 132 (L) 135 - 146 mmol/L   Potassium 3.8 3.5 - 5.3 mmol/L   Chloride 94 (L) 98 - 110 mmol/L   CO2 30 20 - 32 mmol/L   Calcium 9.7 8.6 - 10.3 mg/dL      Assessment & Plan:   Problem List Items Addressed This Visit    None    Visit Diagnoses    Chronic frontal sinusitis    -  Primary   Relevant Medications   amoxicillin-clavulanate (AUGMENTIN) 875-125 MG tablet   Other Relevant Orders   Ambulatory referral to ENT   Left inguinal hernia       Relevant Medications   naproxen (NAPROSYN) 500 MG tablet      #Recurrent sinusitis, chronic Last treated 03/2020, temporary relief Already on Flonase, Atrovent Repeat Augmentin course Will refer to ENT, may warrant sinus imaging CT and consultation on surgical options if recurrent sinusitis as requested by patient.  #L inguinal hernia Persistent problem, upcoming surgical intervention repair 10/20, now about 3 weeks  ago, asking about pain management, offered him NSAID Naproxen 500 BID, advised avoid all other oral nsaid - Next if need we can offer Gabapentin PRN 100-300mg  - Advised if stronger medication required, I would ask that he contact his Gen Surgery office Dr Celine Ahr to discuss  Orders Placed This Encounter  Procedures  . Ambulatory referral to ENT    Referral Priority:   Routine    Referral Type:   Consultation    Referral Reason:   Specialty Services Required    Requested Specialty:   Otolaryngology    Number of Visits Requested:   1     Meds ordered this encounter  Medications  . naproxen (NAPROSYN) 500 MG tablet    Sig: Take 1 tablet (500 mg total) by mouth 2 (two) times daily with a meal. For 2-4 weeks then as needed    Dispense:  60 tablet    Refill:  0  . amoxicillin-clavulanate (AUGMENTIN) 875-125 MG tablet    Sig: Take 1 tablet by mouth 2 (two) times daily.    Dispense:  20 tablet    Refill:  0      Follow up plan: Return if symptoms worsen or fail to improve.   Nobie Putnam, DO [facility] 06/23/2020, 4:17 PM

## 2020-06-30 ENCOUNTER — Encounter
Admission: RE | Admit: 2020-06-30 | Discharge: 2020-06-30 | Disposition: A | Discharge status: Home / Self Care | Payer: BC Managed Care – PPO | Source: Ambulatory Visit | Attending: General Surgery | Admitting: General Surgery

## 2020-06-30 ENCOUNTER — Other Ambulatory Visit: Payer: Self-pay

## 2020-06-30 NOTE — Patient Instructions (Signed)
Your procedure is scheduled on: 07/09/20 Report to [facility]. To find out your arrival time please call [phone] between 1PM - 3PM on 07/08/20.  Remember: Instructions that are not followed completely may result in serious medical risk, up to and including death, or upon the discretion of your surgeon and anesthesiologist your surgery may need to be rescheduled.     _X__ 1. Do not eat food after midnight the night before your procedure.                 No gum chewing or hard candies. You may drink clear liquids up to 2 hours                 before you are scheduled to arrive for your surgery- DO not drink clear                 liquids within 2 hours of the start of your surgery.                 Clear Liquids include:  water, apple juice without pulp, clear carbohydrate                 drink such as Clearfast or Gatorade, Black Coffee or Tea (Do not add                 anything to coffee or tea). Diabetics water only  __X__2.  On the morning of surgery brush your teeth with toothpaste and water, you                 may rinse your mouth with mouthwash if you wish.  Do not swallow any              toothpaste of mouthwash.     _X__ 3.  No Alcohol for 24 hours before or after surgery.   _X__ 4.  Do Not Smoke or use e-cigarettes For 24 Hours Prior to Your Surgery.                 Do not use any chewable tobacco products for at least 6 hours prior to                 surgery.  ____  5.  Bring all medications with you on the day of surgery if instructed.   __X__  6.  Notify your doctor if there is any change in your medical condition      (cold, fever, infections).     Do not wear jewelry, make-up, hairpins, clips or nail polish. Do not wear lotions, powders, or perfumes.  Do not shave 48 hours prior to surgery. Men may shave face and neck. Do not bring valuables to the hospital.    [facility] is not responsible for any belongings  or valuables.  Contacts, dentures/partials or body piercings may not be worn into surgery. Bring a case for your contacts, glasses or hearing aids, a denture cup will be supplied. Leave your suitcase in the car. After surgery it may be brought to your room. For patients admitted to the hospital, discharge time is determined by your treatment team.   Patients discharged the day of surgery will not be allowed to drive home.   Please read over the following fact sheets that you were given:   MRSA Information  __X__ Take these medicines the morning of surgery with A SIP OF WATER:  1. amLODipine (NORVASC) 10 MG tablet  2.   3.   4.  5.  6.  ____ Fleet Enema (as directed)   __X__ Use CHG Soap/SAGE wipes as directed  ____ Use inhalers on the day of surgery  ____ Stop metformin/Janumet/Farxiga 2 days prior to surgery    ____ Take 1/2 of usual insulin dose the night before surgery. No insulin the morning          of surgery.   ____ Stop Blood Thinners Coumadin/Plavix/Xarelto/Pleta/Pradaxa/Eliquis/Effient/Aspirin  on   Or contact your Surgeon, Cardiologist or Medical Doctor regarding  ability to stop your blood thinners  __X__ Stop Anti-inflammatories 7 days before surgery such as Advil, Ibuprofen, Motrin,  BC or Goodies Powder, Naprosyn, Naproxen, Aleve, Aspirin    __X__ Stop all herbal supplements, fish oil or vitamin E until after surgery.    ____ Bring C-Pap to the hospital.   YOU MAY USE OVER THE COUNTER TYLENOL FOR PAIN IF NEEDED   How to Use Chlorhexidine for Bathing Chlorhexidine gluconate (CHG) is a germ-killing (antiseptic) solution that is used to clean the skin. It can get rid of the bacteria that normally live on the skin and can keep them away for about 24 hours. To clean your skin with CHG, you may be given:  A CHG solution to use in the shower or as part of a sponge bath.  A prepackaged cloth that contains CHG. Cleaning your skin with CHG may help lower the  risk for infection:  While you are staying in the intensive care unit of the hospital.  If you have a vascular access, such as a central line, to provide short-term or long-term access to your veins.  If you have a catheter to drain urine from your bladder.  If you are on a ventilator. A ventilator is a machine that helps you breathe by moving air in and out of your lungs.  After surgery. What are the risks? Risks of using CHG include:  A skin reaction.  Hearing loss, if CHG gets in your ears.  Eye injury, if CHG gets in your eyes and is not rinsed out.  The CHG product catching fire. Make sure that you avoid smoking and flames after applying CHG to your skin. Do not use CHG:  If you have a chlorhexidine allergy or have previously reacted to chlorhexidine.  On babies younger than 24 months of age. How to use CHG solution  Use CHG only as told by your health care provider, and follow the instructions on the label.  Use the full amount of CHG as directed. Usually, this is one bottle. During a shower Follow these steps when using CHG solution during a shower (unless your health care provider gives you different instructions): 1. Start the shower. 2. Use your normal soap and shampoo to wash your face and hair. 3. Turn off the shower or move out of the shower stream. 4. Pour the CHG onto a clean washcloth. Do not use any type of brush or rough-edged sponge. 5. Starting at your neck, lather your body down to your toes. Make sure you follow these instructions: ? If you will be having surgery, pay special attention to the part of your body where you will be having surgery. Scrub this area for at least 1 minute. ? Do not use CHG on your head or face. If the solution gets into your ears or eyes, rinse them well with water. ? Avoid your genital area. ? Avoid any  areas of skin that have broken skin, cuts, or scrapes. ? Scrub your back and under your arms. Make sure to wash skin  folds. 6. Let the lather sit on your skin for 1-2 minutes or as long as told by your health care provider. 7. Thoroughly rinse your entire body in the shower. Make sure that all body creases and crevices are rinsed well. 8. Dry off with a clean towel. Do not put any substances on your body afterward--such as powder, lotion, or perfume--unless you are told to do so by your health care provider. Only use lotions that are recommended by the manufacturer. 9. Put on clean clothes or pajamas. 10. If it is the night before your surgery, sleep in clean sheets.  During a sponge bath Follow these steps when using CHG solution during a sponge bath (unless your health care provider gives you different instructions): 1. Use your normal soap and shampoo to wash your face and hair. 2. Pour the CHG onto a clean washcloth. 3. Starting at your neck, lather your body down to your toes. Make sure you follow these instructions: ? If you will be having surgery, pay special attention to the part of your body where you will be having surgery. Scrub this area for at least 1 minute. ? Do not use CHG on your head or face. If the solution gets into your ears or eyes, rinse them well with water. ? Avoid your genital area. ? Avoid any areas of skin that have broken skin, cuts, or scrapes. ? Scrub your back and under your arms. Make sure to wash skin folds. 4. Let the lather sit on your skin for 1-2 minutes or as long as told by your health care provider. 5. Using a different clean, wet washcloth, thoroughly rinse your entire body. Make sure that all body creases and crevices are rinsed well. 6. Dry off with a clean towel. Do not put any substances on your body afterward--such as powder, lotion, or perfume--unless you are told to do so by your health care provider. Only use lotions that are recommended by the manufacturer. 7. Put on clean clothes or pajamas. 8. If it is the night before your surgery, sleep in clean  sheets. How to use CHG prepackaged cloths  Only use CHG cloths as told by your health care provider, and follow the instructions on the label.  Use the CHG cloth on clean, dry skin.  Do not use the CHG cloth on your head or face unless your health care provider tells you to.  When washing with the CHG cloth: ? Avoid your genital area. ? Avoid any areas of skin that have broken skin, cuts, or scrapes. Before surgery Follow these steps when using a CHG cloth to clean before surgery (unless your health care provider gives you different instructions): 1. Using the CHG cloth, vigorously scrub the part of your body where you will be having surgery. Scrub using a back-and-forth motion for 3 minutes. The area on your body should be completely wet with CHG when you are done scrubbing. 2. Do not rinse. Discard the cloth and let the area air-dry. Do not put any substances on the area afterward, such as powder, lotion, or perfume. 3. Put on clean clothes or pajamas. 4. If it is the night before your surgery, sleep in clean sheets.  For general bathing Follow these steps when using CHG cloths for general bathing (unless your health care provider gives you different instructions). 1. Use a  separate CHG cloth for each area of your body. Make sure you wash between any folds of skin and between your fingers and toes. Wash your body in the following order, switching to a new cloth after each step: ? The front of your neck, shoulders, and chest. ? Both of your arms, under your arms, and your hands. ? Your stomach and groin area, avoiding the genitals. ? Your right leg and foot. ? Your left leg and foot. ? The back of your neck, your back, and your buttocks. 2. Do not rinse. Discard the cloth and let the area air-dry. Do not put any substances on your body afterward--such as powder, lotion, or perfume--unless you are told to do so by your health care provider. Only use lotions that are recommended by the  manufacturer. 3. Put on clean clothes or pajamas. Contact a health care provider if:  Your skin gets irritated after scrubbing.  You have questions about using your solution or cloth. Get help right away if:  Your eyes become very red or swollen.  Your eyes itch badly.  Your skin itches badly and is red or swollen.  Your hearing changes.  You have trouble seeing.  You have swelling or tingling in your mouth or throat.  You have trouble breathing.  You swallow any chlorhexidine. Summary  Chlorhexidine gluconate (CHG) is a germ-killing (antiseptic) solution that is used to clean the skin. Cleaning your skin with CHG may help to lower your risk for infection.  You may be given CHG to use for bathing. It may be in a bottle or in a prepackaged cloth to use on your skin. Carefully follow your health care provider's instructions and the instructions on the product label.  Do not use CHG if you have a chlorhexidine allergy.  Contact your health care provider if your skin gets irritated after scrubbing. This information is not intended to replace advice given to you by your health care provider. Make sure you discuss any questions you have with your health care provider. Document Revised: 11/23/2018 Document Reviewed: 08/04/2017 Elsevier Patient Education  [place].

## 2020-07-07 ENCOUNTER — Other Ambulatory Visit: Payer: Self-pay

## 2020-07-07 ENCOUNTER — Encounter
Admission: RE | Admit: 2020-07-07 | Discharge: 2020-07-07 | Disposition: A | Discharge status: Home / Self Care | Payer: BC Managed Care – PPO | Source: Ambulatory Visit | Attending: General Surgery | Admitting: General Surgery

## 2020-07-07 DIAGNOSIS — I10 Essential (primary) hypertension: Secondary | ICD-10-CM | POA: Diagnosis not present

## 2020-07-07 DIAGNOSIS — Z20822 Contact with and (suspected) exposure to covid-19: Secondary | ICD-10-CM | POA: Insufficient documentation

## 2020-07-07 LAB — SARS CORONAVIRUS 2 (TAT 6-24 HRS): SARS Coronavirus 2: NEGATIVE

## 2020-07-08 MED ORDER — LACTATED RINGERS IV SOLN: INTRAVENOUS | Status: DC

## 2020-07-08 MED ORDER — GABAPENTIN 300 MG PO CAPS: 300.0000 mg | ORAL_CAPSULE | ORAL | Status: AC

## 2020-07-08 MED ORDER — CHLORHEXIDINE GLUCONATE 0.12 % MT SOLN: 15.0000 mL | Freq: Once | OROMUCOSAL | Status: AC

## 2020-07-08 MED ORDER — ORAL CARE MOUTH RINSE: 15.0000 mL | Freq: Once | OROMUCOSAL | Status: AC

## 2020-07-08 MED ORDER — CELECOXIB 200 MG PO CAPS: 200.0000 mg | ORAL_CAPSULE | ORAL | Status: AC

## 2020-07-08 MED ORDER — FAMOTIDINE 20 MG PO TABS: 20.0000 mg | ORAL_TABLET | Freq: Once | ORAL | Status: AC

## 2020-07-08 MED ORDER — BUPIVACAINE LIPOSOME 1.3 % IJ SUSP: 20.0000 mL | Freq: Once | INTRAMUSCULAR | Status: DC

## 2020-07-08 MED ORDER — ACETAMINOPHEN 500 MG PO TABS: 1000.0000 mg | ORAL_TABLET | ORAL | Status: AC

## 2020-07-08 MED ORDER — CEFAZOLIN SODIUM-DEXTROSE 2-4 GM/100ML-% IV SOLN
2.0000 g | INTRAVENOUS | Status: AC
  Administered 2020-07-09: 2 g via INTRAVENOUS

## 2020-07-09 ENCOUNTER — Ambulatory Visit: Payer: BC Managed Care – PPO | Admitting: Anesthesiology

## 2020-07-09 ENCOUNTER — Encounter
Admission: RE | Disposition: A | Discharge status: Home / Self Care | Payer: Self-pay | Source: Home / Self Care | Attending: General Surgery

## 2020-07-09 ENCOUNTER — Other Ambulatory Visit: Payer: Self-pay | Admitting: Family Medicine

## 2020-07-09 ENCOUNTER — Other Ambulatory Visit: Payer: Self-pay

## 2020-07-09 ENCOUNTER — Other Ambulatory Visit: Payer: BC Managed Care – PPO

## 2020-07-09 ENCOUNTER — Encounter: Payer: Self-pay | Admitting: General Surgery

## 2020-07-09 ENCOUNTER — Ambulatory Visit
Admission: RE | Admit: 2020-07-09 | Discharge: 2020-07-09 | Disposition: A | Discharge status: Home / Self Care | Payer: BC Managed Care – PPO | Attending: General Surgery | Admitting: General Surgery

## 2020-07-09 DIAGNOSIS — Z79899 Other long term (current) drug therapy: Secondary | ICD-10-CM | POA: Insufficient documentation

## 2020-07-09 DIAGNOSIS — Z87891 Personal history of nicotine dependence: Secondary | ICD-10-CM | POA: Insufficient documentation

## 2020-07-09 DIAGNOSIS — M6208 Separation of muscle (nontraumatic), other site: Secondary | ICD-10-CM | POA: Insufficient documentation

## 2020-07-09 DIAGNOSIS — I10 Essential (primary) hypertension: Secondary | ICD-10-CM | POA: Diagnosis not present

## 2020-07-09 DIAGNOSIS — K4090 Unilateral inguinal hernia, without obstruction or gangrene, not specified as recurrent: Secondary | ICD-10-CM | POA: Diagnosis not present

## 2020-07-09 DIAGNOSIS — D175 Benign lipomatous neoplasm of intra-abdominal organs: Secondary | ICD-10-CM | POA: Diagnosis not present

## 2020-07-09 HISTORY — PX: XI ROBOTIC ASSISTED INGUINAL HERNIA REPAIR WITH MESH: SHX6706

## 2020-07-09 SURGERY — REPAIR, HERNIA, INGUINAL, ROBOT-ASSISTED, LAPAROSCOPIC, USING MESH
Anesthesia: General | Laterality: Left

## 2020-07-09 MED ORDER — ROCURONIUM BROMIDE 100 MG/10ML IV SOLN
INTRAVENOUS | Status: DC | PRN
  Administered 2020-07-09: 20 mg via INTRAVENOUS
  Administered 2020-07-09: 50 mg via INTRAVENOUS
  Administered 2020-07-09: 20 mg via INTRAVENOUS

## 2020-07-09 MED ORDER — CHLORHEXIDINE GLUCONATE CLOTH 2 % EX PADS: 6.0000 | MEDICATED_PAD | Freq: Once | CUTANEOUS | Status: DC

## 2020-07-09 MED ORDER — OXYCODONE HCL 5 MG PO TABS: 5.0000 mg | ORAL_TABLET | Freq: Once | ORAL | Status: AC | PRN

## 2020-07-09 MED ORDER — HYDROCODONE-ACETAMINOPHEN 5-325 MG PO TABS: 1.0000 | ORAL_TABLET | Freq: Four times a day (QID) | ORAL | 0 refills | Status: DC | PRN

## 2020-07-09 MED ORDER — FENTANYL CITRATE (PF) 100 MCG/2ML IJ SOLN
INTRAMUSCULAR | Status: AC
  Administered 2020-07-09: 25 ug via INTRAVENOUS
  Filled 2020-07-09: qty 2

## 2020-07-09 MED ORDER — LIDOCAINE-EPINEPHRINE 1 %-1:100000 IJ SOLN
INTRAMUSCULAR | Status: DC | PRN
  Administered 2020-07-09: 7 mL via INTRAMUSCULAR

## 2020-07-09 MED ORDER — ONDANSETRON HCL 4 MG/2ML IJ SOLN
INTRAMUSCULAR | Status: DC | PRN
  Administered 2020-07-09: 4 mg via INTRAVENOUS

## 2020-07-09 MED ORDER — MIDAZOLAM HCL 2 MG/2ML IJ SOLN
INTRAMUSCULAR | Status: DC | PRN
  Administered 2020-07-09: 2 mg via INTRAVENOUS

## 2020-07-09 MED ORDER — TRIAMTERENE-HCTZ 37.5-25 MG PO TABS: 1.0000 | ORAL_TABLET | Freq: Every day | ORAL | 1 refills | Status: DC

## 2020-07-09 MED ORDER — OXYCODONE HCL 5 MG PO TABS
ORAL_TABLET | ORAL | Status: AC
  Administered 2020-07-09: 5 mg via ORAL
  Filled 2020-07-09: qty 1

## 2020-07-09 MED ORDER — LIDOCAINE HCL (CARDIAC) PF 100 MG/5ML IV SOSY
PREFILLED_SYRINGE | INTRAVENOUS | Status: DC | PRN
  Administered 2020-07-09: 100 mg via INTRAVENOUS

## 2020-07-09 MED ORDER — CELECOXIB 200 MG PO CAPS
ORAL_CAPSULE | ORAL | Status: AC
  Administered 2020-07-09: 200 mg via ORAL
  Filled 2020-07-09: qty 1

## 2020-07-09 MED ORDER — CHLORHEXIDINE GLUCONATE 0.12 % MT SOLN
OROMUCOSAL | Status: AC
  Administered 2020-07-09: 15 mL via OROMUCOSAL
  Filled 2020-07-09: qty 15

## 2020-07-09 MED ORDER — MIDAZOLAM HCL 2 MG/2ML IJ SOLN
INTRAMUSCULAR | Status: AC
  Filled 2020-07-09: qty 2

## 2020-07-09 MED ORDER — DEXAMETHASONE SODIUM PHOSPHATE 10 MG/ML IJ SOLN
INTRAMUSCULAR | Status: DC | PRN
  Administered 2020-07-09: 10 mg via INTRAVENOUS

## 2020-07-09 MED ORDER — ONDANSETRON HCL 4 MG/2ML IJ SOLN: 4.0000 mg | Freq: Once | INTRAMUSCULAR | Status: DC | PRN

## 2020-07-09 MED ORDER — IBUPROFEN 800 MG PO TABS: 800.0000 mg | ORAL_TABLET | Freq: Three times a day (TID) | ORAL | 0 refills | Status: AC | PRN

## 2020-07-09 MED ORDER — SUGAMMADEX SODIUM 200 MG/2ML IV SOLN
INTRAVENOUS | Status: DC | PRN
  Administered 2020-07-09: 200 mg via INTRAVENOUS

## 2020-07-09 MED ORDER — FENTANYL CITRATE (PF) 100 MCG/2ML IJ SOLN
INTRAMUSCULAR | Status: DC | PRN
  Administered 2020-07-09 (×2): 50 ug via INTRAVENOUS

## 2020-07-09 MED ORDER — FENTANYL CITRATE (PF) 100 MCG/2ML IJ SOLN
25.0000 ug | INTRAMUSCULAR | Status: DC | PRN
  Administered 2020-07-09: 50 ug via INTRAVENOUS

## 2020-07-09 MED ORDER — OXYCODONE HCL 5 MG/5ML PO SOLN: 5.0000 mg | Freq: Once | ORAL | Status: AC | PRN

## 2020-07-09 MED ORDER — GABAPENTIN 300 MG PO CAPS
ORAL_CAPSULE | ORAL | Status: AC
  Administered 2020-07-09: 300 mg via ORAL
  Filled 2020-07-09: qty 1

## 2020-07-09 MED ORDER — CEFAZOLIN SODIUM-DEXTROSE 2-4 GM/100ML-% IV SOLN
INTRAVENOUS | Status: AC
  Filled 2020-07-09: qty 100

## 2020-07-09 MED ORDER — ACETAMINOPHEN 500 MG PO TABS
ORAL_TABLET | ORAL | Status: AC
  Administered 2020-07-09: 1000 mg via ORAL
  Filled 2020-07-09: qty 2

## 2020-07-09 MED ORDER — PROPOFOL 10 MG/ML IV BOLUS
INTRAVENOUS | Status: AC
  Filled 2020-07-09: qty 20

## 2020-07-09 MED ORDER — FAMOTIDINE 20 MG PO TABS
ORAL_TABLET | ORAL | Status: AC
  Administered 2020-07-09: 20 mg via ORAL
  Filled 2020-07-09: qty 1

## 2020-07-09 MED ORDER — FENTANYL CITRATE (PF) 100 MCG/2ML IJ SOLN
INTRAMUSCULAR | Status: AC
  Filled 2020-07-09: qty 2

## 2020-07-09 SURGICAL SUPPLY — 51 items
ADH SKN CLS APL DERMABOND .7 (GAUZE/BANDAGES/DRESSINGS) ×1
APL PRP STRL LF DISP 70% ISPRP (MISCELLANEOUS) ×1
CANISTER SUCT 1200ML W/VALVE (MISCELLANEOUS) ×2 IMPLANT
CHLORAPREP W/TINT 26 (MISCELLANEOUS) ×2 IMPLANT
COVER TIP SHEARS 8 DVNC (MISCELLANEOUS) ×1 IMPLANT
COVER TIP SHEARS 8MM DA VINCI (MISCELLANEOUS) ×2
COVER WAND RF STERILE (DRAPES) ×4 IMPLANT
DEFOGGER SCOPE WARMER CLEARIFY (MISCELLANEOUS) ×2 IMPLANT
DERMABOND ADVANCED (GAUZE/BANDAGES/DRESSINGS) ×1
DERMABOND ADVANCED .7 DNX12 (GAUZE/BANDAGES/DRESSINGS) ×1 IMPLANT
DRAPE ARM DVNC X/XI (DISPOSABLE) ×3 IMPLANT
DRAPE COLUMN DVNC XI (DISPOSABLE) ×1 IMPLANT
DRAPE DA VINCI XI ARM (DISPOSABLE) ×6
DRAPE DA VINCI XI COLUMN (DISPOSABLE) ×2
ELECT CAUTERY BLADE 6.4 (BLADE) ×2 IMPLANT
ELECT REM PT RETURN 9FT ADLT (ELECTROSURGICAL) ×2
ELECTRODE REM PT RTRN 9FT ADLT (ELECTROSURGICAL) ×1 IMPLANT
GLOVE BIO SURGEON STRL SZ 6.5 (GLOVE) ×4 IMPLANT
GOWN STRL REUS W/ TWL LRG LVL3 (GOWN DISPOSABLE) ×3 IMPLANT
GOWN STRL REUS W/TWL LRG LVL3 (GOWN DISPOSABLE) ×6
IRRIGATOR SUCT 8 DISP DVNC XI (IRRIGATION / IRRIGATOR) IMPLANT
IRRIGATOR SUCTION 8MM XI DISP (IRRIGATION / IRRIGATOR)
IV NS 1000ML (IV SOLUTION)
IV NS 1000ML BAXH (IV SOLUTION) IMPLANT
KIT PINK PAD W/HEAD ARE REST (MISCELLANEOUS) ×2
KIT PINK PAD W/HEAD ARM REST (MISCELLANEOUS) ×1 IMPLANT
LABEL OR SOLS (LABEL) IMPLANT
MESH PROGRIP LAP SEF FIX 15X10 (Mesh General) IMPLANT
MESH PROGRIP LAP SELF FIXATING (Mesh General) ×2 IMPLANT
NDL INSUFFLATION 14GA 120MM (NEEDLE) IMPLANT
NEEDLE HYPO 22GX1.5 SAFETY (NEEDLE) ×2 IMPLANT
NEEDLE INSUFFLATION 14GA 120MM (NEEDLE) IMPLANT
NS IRRIG 500ML POUR BTL (IV SOLUTION) ×2 IMPLANT
OBTURATOR OPTICAL STANDARD 8MM (TROCAR) ×2
OBTURATOR OPTICAL STND 8 DVNC (TROCAR) ×1
OBTURATOR OPTICALSTD 8 DVNC (TROCAR) ×1 IMPLANT
PACK LAP CHOLECYSTECTOMY (MISCELLANEOUS) ×2 IMPLANT
PENCIL ELECTRO HAND CTR (MISCELLANEOUS) ×2 IMPLANT
SEAL CANN UNIV 5-8 DVNC XI (MISCELLANEOUS) ×3 IMPLANT
SEAL XI 5MM-8MM UNIVERSAL (MISCELLANEOUS) ×6
SET TUBE SMOKE EVAC HIGH FLOW (TUBING) ×2 IMPLANT
SOLUTION ELECTROLUBE (MISCELLANEOUS) ×2 IMPLANT
STRIP CLOSURE SKIN 1/2X4 (GAUZE/BANDAGES/DRESSINGS) ×2 IMPLANT
SUT MNCRL 4-0 (SUTURE) ×2
SUT MNCRL 4-0 27XMFL (SUTURE) ×1
SUT VIC AB 2-0 SH 27 (SUTURE)
SUT VIC AB 2-0 SH 27XBRD (SUTURE) IMPLANT
SUT VLOC 90 6 CV-15 VIOLET (SUTURE) ×1 IMPLANT
SUT VLOC 90 S/L VL9 GS22 (SUTURE) IMPLANT
SUTURE MNCRL 4-0 27XMF (SUTURE) ×1 IMPLANT
TRAY FOLEY MTR SLVR 16FR STAT (SET/KITS/TRAYS/PACK) ×2 IMPLANT

## 2020-07-09 NOTE — Op Note (Signed)
Robot-Assisted Laparoscopic Transabdominal Inguinal Hernia Repair  Pre-operative Diagnosis: left inguinal Hernia   Post-operative Diagnosis: Same   Procedure: robot-assisted laparoscopic repair of a left inguinal hernia   Surgeon: Fredirick Maudlin, MD   Anesthesia: GETA   Findings: direct left inguinal hernia      Procedure Details  The patient was seen again in the holding room. The benefits, complications, treatment options, and expected outcomes were discussed with the patient. The risks of bleeding, infection, recurrence of symptoms, failure to resolve symptoms, recurrence of hernia, ischemic orchitis, chronic pain syndrome or neuroma, were discussed again. The likelihood of improving the patient's symptoms with return to their baseline status is good.  The patient and/or family concurred with the proposed plan, giving informed consent.   The patient was brought to the operating room and placed supine on the OR table.  Prior to the induction of general anesthesia, antibiotic prophylaxis was administered. VTE prophylaxis was in place. General endotracheal anesthesia was then administered and tolerated well. After the induction, the abdomen was prepped with Chloraprep and draped in the sterile fashion.  A Foley catheter was aseptically placed.  The patient wa sterilely prepped and draped in standard fashion.  A timeout was performed confirming the patient's identity, the procedure being performed, his allergies, all necessary equipment was available, and that maintenance anesthesia was adequate.   Optiview technique was used to enter the abdomen in a supraumbilical location, just off of the midline.  Pneumoperitoneum was achieved without hemodynamic changes.  Two 8 mm robotic trochars were placed under direct vision.  The Optiview trocar was replaced with a third 8 mm robotic trocar.  The skin and subcutaneous tissues were infiltrated with local anesthetic prior to making the incisions and  placing the trochars.  Laparoscopy revealed a modest direct inguinal hernia on the left.  The robot was brought to the table and docked in the standard fashion.  No collision between the arms was observed.  The instruments were kept under direct view at all times.  We elevated the peritoneal flap on the left. The pseudo-sac was reduced and dissected free from adjacent structures. We preserved the vas and the vessels. Once dissection was completed a 10 x 15 progrip mesh was placed and good adherence was achieved.  Hemostasis was confirmed. There was excellent coverage of the direct, indirect and femoral spaces. The flap was closed with a V-Loc suture   Second look revealed no complications or injuries.  The robot was undocked and withdrawn from the surgical field.   The ports were then removed.  Each was closed with interrupted subcuticular Monocryl.  The skin was cleaned.  Dermabond and Steri-Strips were applied.  The Foley catheter was removed.  The patient was awakened, extubated, and taken to the postanesthesia care unit in good condition.  EBL: Minimal  IVF: See anesthesia record  Specimens: None  Complications: None immediately apparent      Fredirick Maudlin, MD, FACS

## 2020-07-09 NOTE — Anesthesia Procedure Notes (Signed)
Procedure Name: Intubation Performed by: Philbert Riser, CRNA

## 2020-07-09 NOTE — Telephone Encounter (Signed)
PT need a refill  triamterene-hydrochlorothiazide (MAXZIDE-25) 37.5-25 MG tablet [[id number]]  [facility] [address] [place]  [place] [address]  Phone: [phone] Fax: [phone]

## 2020-07-09 NOTE — Discharge Instructions (Signed)
AMBULATORY SURGERY  DISCHARGE INSTRUCTIONS   1) The drugs that you were given will stay in your system until tomorrow so for the next 24 hours you should not:  A) Drive an automobile B) Make any legal decisions C) Drink any alcoholic beverage   2) You may resume regular meals tomorrow.  Today it is better to start with liquids and gradually work up to solid foods.  You may eat anything you prefer, but it is better to start with liquids, then soup and crackers, and gradually work up to solid foods.   3) Please notify your doctor immediately if you have any unusual bleeding, trouble breathing, redness and pain at the surgery site, drainage, fever, or pain not relieved by medication.    4) Additional Instructions:        Please contact your physician with any problems or Same Day Surgery at [phone], Monday through Friday 6 am to 4 pm, or [place] [facility] number at [phone].   Laparoscopic Inguinal Hernia Repair, Adult, Care After This sheet gives you information about how to care for yourself after your procedure. Your health care provider may also give you more specific instructions. If you have problems or questions, contact your health care provider. What can I expect after the procedure? After the procedure, it is common to have:  Pain.  Swelling and bruising around the incision area.  Scrotal swelling, in men.  Some fluid or blood draining from your incisions. Follow these instructions at home: Incision care  Follow instructions from your health care provider about how to take care of your incisions. Make sure you: ? Wash your hands with soap and water before you change your bandage (dressing). If soap and water are not available, use hand sanitizer. ? Change your dressing as told by your health care provider. ? Leave stitches (sutures), skin glue, or adhesive strips in place. These skin closures may need to stay in place for 2 weeks or longer.  If adhesive strip edges start to loosen and curl up, you may trim the loose edges. Do not remove adhesive strips completely unless your health care provider tells you to do that.  Check your incision area every day for signs of infection. Check for: ? More redness, swelling, or pain. ? More fluid or blood. ? Warmth. ? Pus or a bad smell.  Wear loose, soft clothing while your incisions heal. Driving  Do not drive or use heavy machinery while taking prescription pain medicine.  Do not drive for 24 hours if you were given a medicine to help you relax (sedative) during your procedure. Activity  Do not lift anything that is heavier than 10 lb (4.5 kg), or the limit that you are told, until your health care provider says that it is safe.  Ask your health care provider what activities are safe for you. A lot of activity during the first week after surgery can increase pain and swelling. For 1 week after your procedure: ? Avoid activities that take a lot of effort, such as exercise or sports. ? You may walk and climb stairs as needed for daily activity, but avoid long walks or climbing stairs for exercise. Managing pain and swelling   Put ice on painful or swollen areas: ? Put ice in a plastic bag. ? Place a towel between your skin and the bag. ? Leave the ice on for 20 minutes, 2-3 times a day. General instructions  Do not take baths, swim, or use  a hot tub until your health care provider approves. Ask your health care provider if you may take showers. You may only be allowed to take sponge baths.  Take over-the-counter and prescription medicines only as told by your health care provider.  To prevent or treat constipation while you are taking prescription pain medicine, your health care provider may recommend that you: ? Drink enough fluid to keep your urine pale yellow. ? Take over-the-counter or prescription medicines. ? Eat foods that are high in fiber, such as fresh fruits and  vegetables, whole grains, and beans. ? Limit foods that are high in fat and processed sugars, such as fried and sweet foods.  Do not use any products that contain nicotine or tobacco, such as cigarettes and e-cigarettes. If you need help quitting, ask your health care provider.  Drink enough fluid to keep your urine pale yellow.  Keep all follow-up visits as told by your health care provider. This is important. Contact a health care provider if:  You have more redness, swelling, or pain around your incisions or your groin area.  You have more swelling in your scrotum.  You have more fluid or blood coming from your incisions.  Your incisions feel warm to the touch.  You have severe pain and medicines do not help.  You have abdominal pain or swelling.  You cannot eat or drink without vomiting.  You cannot urinate or pass a bowel movement.  You faint.  You feel dizzy.  You have nausea and vomiting.  You have a fever. Get help right away if:  You have pus or a bad smell coming from your incisions.  You have redness, warmth, or pain in your leg.  You have chest pain.  You have problems breathing. Summary  Pain, swelling, and bruising are common after the procedure.  Check your incision area every day for signs of infection, such as more redness, swelling, or pain.  Put ice on painful or swollen areas for 20 minutes, 2-3 times a day. This information is not intended to replace advice given to you by your health care provider. Make sure you discuss any questions you have with your health care provider. Document Revised: 02/14/2019 Document Reviewed: 12/16/2016 Elsevier Patient Education  [place].

## 2020-07-09 NOTE — Anesthesia Postprocedure Evaluation (Incomplete)
Anesthesia Post Note  Patient: Nathan Wade  Procedure(s) Performed: XI ROBOTIC ASSISTED INGUINAL HERNIA REPAIR WITH MESH (Left )  Anesthesia Type: General Anesthetic complications: no   No complications documented.   Last Vitals:  Vitals:   07/09/20 0930 07/09/20 1521  BP: (!) 160/101 (!) (P) 163/91  Pulse: 61 72  Resp: 16 17  Temp: (!) 36.4 C (!) 36.4 C  SpO2: 100% 100%    Last Pain:  Vitals:   07/09/20 0930  TempSrc: Oral  PainSc: 0-No pain                 Philbert Riser

## 2020-07-09 NOTE — H&P (Signed)
Chief Complaint  Patient presents with  . New Patient (Initial Visit)    Left inguinal hernia and abdominal wall mass    HPI Nathan Wade is a 48 y.o. male.  He has been referred by his primary care provider, Dr. Parks Ranger, for evaluation of a left inguinal hernia and a small right-sided abdominal mass.  He states that he first noticed the hernia about 10 to 12 months ago.  It previously did not bulge much, but over the ensuing time, he has noticed an increase in the bulging area.  He notices it most when he is lifting weights or if he coughs.  He says that when he first noticed it, there was a burning sensation in his right groin, that has since dissipated.  He now notices an occasional tingling sensation in that area when the hernia is particularly prominent.  He denies any nausea or vomiting.  No alterations in bowel function or difficulty with urination.  He says that it is always reducible.  He does have a history of right inguinal hernia repair in the past.  He says that this repair has been durable and he has no symptoms on that side.  He says that the small mass on his right abdomen does not really bother him, but he thinks that it might be growing.  He also mentioned that when he does sit ups, he notices a bulging in his mid abdomen that is vertical, without pain, and only present when he performs this exercise.       Past Medical History:  Diagnosis Date  . Hypertension          Past Surgical History:  Procedure Laterality Date  . BACK SURGERY  1990  . INGUINAL HERNIA REPAIR Right 2003   open repair, with mesh         Family History  Problem Relation Age of Onset  . Heart attack Mother     Social History Social History        Tobacco Use  . Smoking status: Former Smoker    Packs/day: 0.50    Years: 4.00    Pack years: 2.00    Types: Cigarettes    Quit date: 2012    Years since quitting: 9.6  . Smokeless tobacco: Never Used    Substance Use Topics  . Alcohol use: Yes  . Drug use: Never    No Known Allergies        Current Outpatient Medications  Medication Sig Dispense Refill  . amLODipine (NORVASC) 10 MG tablet Take 1 tablet (10 mg total) by mouth daily. 90 tablet 1  . atorvastatin (LIPITOR) 10 MG tablet Take 1 tablet (10 mg total) by mouth daily. 90 tablet 1  . sildenafil (REVATIO) 20 MG tablet Take 1-5 pills about 30 min prior to sex. Start with 1-2 and increase as needed. 100 tablet 2  . tadalafil (CIALIS) 20 MG tablet Take 1 tablet (20 mg total) by mouth every other day as needed for erectile dysfunction. 30 tablet 3  . triamterene-hydrochlorothiazide (MAXZIDE-25) 37.5-25 MG tablet Take 1 tablet by mouth daily. 90 tablet 1  . aspirin EC 81 MG tablet Take 81 mg by mouth daily. (Patient not taking: Reported on 05/13/2020)     No current facility-administered medications for this visit.    Review of Systems Review of Systems  All other systems reviewed and are negative. Or as discussed in the history of present illness  Today's Vitals   07/09/20 0930  BP: (!) 160/101  Pulse: 61  Resp: 16  Temp: (!) 97.5 F (36.4 C)  TempSrc: Oral  SpO2: 100%  Weight: 98 kg  Height: 6\' 1"  (1.854 m)  PainSc: 0-No pain   Body mass index is 28.5 kg/m.   Physical Exam Physical Exam Constitutional:      General: He is not in acute distress.    Appearance: Normal appearance.  HENT:     Head: Normocephalic and atraumatic.     Nose:     Comments: Covered with a mask    Mouth/Throat:     Comments: Covered with a mask Eyes:     General: No scleral icterus.       Right eye: No discharge.        Left eye: No discharge.  Neck:     Comments: There is no palpable cervical or supraclavicular lymphadenopathy.  The trachea is midline.  There are no dominant masses or thyromegaly appreciated.  The gland moves freely with deglutition. Cardiovascular:     Rate and Rhythm: Normal rate and regular rhythm.      Pulses: Normal pulses.  Pulmonary:     Effort: Pulmonary effort is normal.     Breath sounds: Normal breath sounds.  Abdominal:     Palpations: Abdomen is soft.       Comments: Diastasis rectus is present.  There is a soft, oblong, roughly 1.5 cm subcutaneous mass consistent with a lipoma..  Genitourinary:      Comments: There is a small reducible left inguinal hernia.  He has a scar on the right from his prior open hernia repair. Musculoskeletal:        General: No swelling or tenderness.  Skin:    General: Skin is warm and dry.     Comments: Multiple tattoos  Neurological:     General: No focal deficit present.     Mental Status: He is alert and oriented to person, place, and time.  Psychiatric:        Mood and Affect: Mood normal.        Behavior: Behavior normal.     Data Reviewed I reviewed Dr. Christella Scheuermann recent clinic note of March 25, 2020 wherein he documented the presence of the small subcutaneous mass as well as left inguinal hernia and made the referral to general surgery.  Assessment This is a 49 year old man with an increasingly symptomatic left inguinal hernia.  He would like to proceed with repair.  The small subcutaneous lipoma is not bothering him and he would like to continue to simply observe.  The mid abdominal bulging that he notices with sit ups is diastasis rectus and does not require surgical intervention.   Plan I have explained the procedure, risks, and aftercare of inguinal hernia repair to [employer].   Risks include but are not limited to bleeding, infection, wound problems, anesthesia, recurrence, bladder or intestine injury, urinary retention, testicular dysfunction, chronic pain, mesh problems.  He  seems to understand and agrees to proceed.  Questions were answered to his stated satisfaction.  We will proceed with robot-assisted laparoscopic left inguinal hernia today.

## 2020-07-09 NOTE — Anesthesia Preprocedure Evaluation (Signed)
Anesthesia Evaluation  Patient identified by MRN, date of birth, ID band Patient awake    Reviewed: Allergy & Precautions, NPO status , Patient's Chart, lab work & pertinent test results  History of Anesthesia Complications Negative for: history of anesthetic complications  Airway Mallampati: II  TM Distance: >3 FB Neck ROM: Full    Dental no notable dental hx. (+) Teeth Intact   Pulmonary neg pulmonary ROS, neg sleep apnea, neg COPD, Patient abstained from smoking.Not current smoker, former smoker,    Pulmonary exam normal breath sounds clear to auscultation       Cardiovascular Exercise Tolerance: Good METShypertension, (-) CAD and (-) Past MI (-) dysrhythmias  Rhythm:Regular Rate:Normal - Systolic murmurs    Neuro/Psych negative neurological ROS  negative psych ROS   GI/Hepatic neg GERD  ,(+)     (-) substance abuse  ,   Endo/Other  neg diabetes  Renal/GU negative Renal ROS     Musculoskeletal   Abdominal   Peds  Hematology   Anesthesia Other Findings Past Medical History: No date: Hypertension  Reproductive/Obstetrics                             Anesthesia Physical Anesthesia Plan  ASA: II  Anesthesia Plan: General   Post-op Pain Management:    Induction: Intravenous  PONV Risk Score and Plan: 3 and Ondansetron, Dexamethasone and Midazolam  Airway Management Planned: Oral ETT  Additional Equipment: None  Intra-op Plan:   Post-operative Plan: Extubation in OR  Informed Consent: I have reviewed the patients History and Physical, chart, labs and discussed the procedure including the risks, benefits and alternatives for the proposed anesthesia with the patient or authorized representative who has indicated his/her understanding and acceptance.     Dental advisory given  Plan Discussed with: CRNA and Surgeon  Anesthesia Plan Comments: (Discussed risks of anesthesia  with patient, including PONV, sore throat, lip/dental damage. Rare risks discussed as well, such as cardiorespiratory and neurological sequelae. Patient understands.)        Anesthesia Quick Evaluation

## 2020-07-09 NOTE — Addendum Note (Signed)
Addended by: Jefferson Fuel on: 07/09/2020 07:44 AM   Modules accepted: Orders

## 2020-07-09 NOTE — Transfer of Care (Signed)
Immediate Anesthesia Transfer of Care Note  Patient: Nathan Wade  Procedure(s) Performed: XI ROBOTIC ASSISTED INGUINAL HERNIA REPAIR WITH MESH (Left )  Patient Location: PACU  Anesthesia Type:General  Level of Consciousness: awake, alert  and oriented  Airway & Oxygen Therapy: Patient Spontanous Breathing and Patient connected to face mask oxygen  Post-op Assessment: Report given to RN and Post -op Vital signs reviewed and stable  Post vital signs: Reviewed and stable  Last Vitals:  Vitals Value Taken Time  BP 163/91 07/09/20 1521  Temp 36.4 C 07/09/20 1521  Pulse 79 07/09/20 1521  Resp 19 07/09/20 1521  SpO2 100 % 07/09/20 1521  Vitals shown include unvalidated device data.  Last Pain:  Vitals:   07/09/20 0930  TempSrc: Oral  PainSc: 0-No pain         Complications: No complications documented.

## 2020-07-10 ENCOUNTER — Ambulatory Visit (INDEPENDENT_AMBULATORY_CARE_PROVIDER_SITE_OTHER): Payer: BC Managed Care – PPO | Admitting: General Surgery

## 2020-07-10 ENCOUNTER — Encounter: Payer: Self-pay | Admitting: General Surgery

## 2020-07-10 ENCOUNTER — Telehealth: Payer: Self-pay | Admitting: General Surgery

## 2020-07-10 VITALS — BP 152/90 | HR 75 | Temp 98.5°F | Ht 73.0 in | Wt 215.0 lb

## 2020-07-10 DIAGNOSIS — Z9889 Other specified postprocedural states: Secondary | ICD-10-CM

## 2020-07-10 DIAGNOSIS — Z8719 Personal history of other diseases of the digestive system: Secondary | ICD-10-CM

## 2020-07-10 MED ORDER — PREGABALIN 50 MG PO CAPS: 50.0000 mg | ORAL_CAPSULE | Freq: Three times a day (TID) | ORAL | 0 refills | Status: DC

## 2020-07-10 NOTE — Progress Notes (Signed)
Nathan Wade is here today for concerns after undergoing a robot-assisted left inguinal hernia repair yesterday.  He says that he went to bed about 9:00 last night and woke up with significant pain and a burning sensation in his abdomen.  He says that he noticed a bulge at the right-sided port and a lot of pressure in his abdomen.  He says that he was up and down all night with the discomfort.  He also says that he had some pain with urination, but this is getting better.  The prescribed pain medication did not seem to help, but ibuprofen alleviated some of his discomfort.  He contacted our office this morning with his concerns.  He is here now to be evaluated.  He has not had any fevers or chills.  No nausea or vomiting.  Today's Vitals   07/10/20 1427  BP: (!) 152/90  Pulse: 75  Temp: 98.5 F (36.9 C)  SpO2: 96%  Weight: 215 lb (97.5 kg)  Height: 6\' 1"  (1.854 m)   Body mass index is 28.37 kg/m. Focused abdominal examination demonstrates that all of the port sites are intact.  The Steri-Strips are in situ and there is no surrounding erythema or any drainage.  The right sided port does have some swelling beneath it as well as some skin discoloration suggesting a small hematoma.  His lower abdomen has expected postsurgical soreness.  Impression and plan: This is a 49 year old man who underwent a robot-assisted laparoscopic left inguinal hernia yesterday.  It appears that he may have developed a small hematoma under the right port site.  I reassured him that nothing was seriously on this.  I placed him on a pain regimen of 600 mg ibuprofen every 6 hours alternating with Tylenol either 500 mg or 650 mg, also every 6 hours but separated by 3 hours from the ibuprofen.  He should use the narcotic pain medication for breakthrough.  In addition, due to his complaints of burning pain, I have prescribed Lyrica 50 mg 3 times daily.  I will plan to see him in clinic as scheduled in a couple of weeks.  In the  meantime, he should contact our office with any additional concerns or questions.

## 2020-07-10 NOTE — Telephone Encounter (Signed)
Robotic Inguinal hernia repair yesterday- Patient states he layed down and woke with severe pain-doubled over-incision is red, and has a large golf ball size bulge-its hot to the touch-denies fever-no nausea or vomiting-no chills. Denies picking up anything- added to schedule today per Dr.Cannon.

## 2020-07-10 NOTE — Patient Instructions (Addendum)
Take 600 mg of Ibuprofen, three hours later take 2 extra strength Tylenol, three hours after that take the 600 mg of Ibuprofen. Keep rotating medication, and do this for at least 3 days. If you still have break through pain go ahead and take one of your narcotic pain medication.   We have sent in a prescription for Lyrica. This helps with nerve pain. It may make you feel tired and woozy.   The swelling should continue to get better.   Follow up as scheduled.

## 2020-07-10 NOTE — Telephone Encounter (Signed)
Patient is calling and said on his incision site on right side has a large knot the size of a gold ball, feels hot to the touch, patient also said the other two are fine. Pain medication that was sent in was not helping patient said his pain was about a 6 right now and is asking if something else could be called in for the pain, and complains on hurting on the side where the incision site. Please call patient and advise.

## 2020-07-11 NOTE — Anesthesia Postprocedure Evaluation (Signed)
Anesthesia Post Note  Patient: EYTHAN JAYNE  Procedure(s) Performed: XI ROBOTIC ASSISTED INGUINAL HERNIA REPAIR WITH MESH (Left )  Patient location during evaluation: PACU Anesthesia Type: General Level of consciousness: awake and alert and oriented Pain management: pain level controlled Vital Signs Assessment: post-procedure vital signs reviewed and stable Respiratory status: spontaneous breathing, nonlabored ventilation and respiratory function stable Cardiovascular status: blood pressure returned to baseline and stable Postop Assessment: no signs of nausea or vomiting Anesthetic complications: no   No complications documented.   Last Vitals:  Vitals:   07/09/20 1600 07/09/20 1635  BP: (!) 157/95 (!) 165/99  Pulse:  71  Resp: 14 16  Temp: 36.4 C 36.6 C  SpO2:  95%    Last Pain:  Vitals:   07/10/20 0841  TempSrc:   PainSc: 6                  Duquan Gillooly

## 2020-07-14 ENCOUNTER — Other Ambulatory Visit: Payer: Self-pay | Admitting: General Surgery

## 2020-07-14 ENCOUNTER — Telehealth: Payer: Self-pay | Admitting: Family Medicine

## 2020-07-14 ENCOUNTER — Encounter: Payer: Self-pay | Admitting: General Surgery

## 2020-07-14 ENCOUNTER — Telehealth: Payer: Self-pay | Admitting: General Surgery

## 2020-07-14 MED ORDER — PREGABALIN 50 MG PO CAPS: 50.0000 mg | ORAL_CAPSULE | Freq: Three times a day (TID) | ORAL | 0 refills | Status: DC

## 2020-07-14 MED ORDER — HYDROCODONE-ACETAMINOPHEN 5-325 MG PO TABS: 1.0000 | ORAL_TABLET | Freq: Four times a day (QID) | ORAL | 0 refills | Status: DC | PRN

## 2020-07-14 NOTE — Telephone Encounter (Signed)
Patient is calling us and he also called Caryl Pina at [facility] asking for a refill on his hydrochlorothiazide, Caryl Pina said the patient told her he was told to call there office for the refill  Caryl Pina can be reached at [phone]. Please call and advise.

## 2020-07-14 NOTE — Telephone Encounter (Signed)
Pt had hernia surgery on 07-09-2020 and was given only #15 hydrocodone-tylenol by dr cannon. Per Pt he was told to request more medication from his PCP. The medication is working. [facility] [address] in [place]

## 2020-07-14 NOTE — Telephone Encounter (Signed)
Patient is calling asking if he can get a refill of his Hydrocodone for pain control. He would like enough for about 3-4 more days. He states that he has taken all of his Lyrica also. He states that this did work for him also and would be fine with a refill of that instead of the Hydrocodone.

## 2020-07-14 NOTE — Telephone Encounter (Signed)
Sent in refills on both meds.

## 2020-07-14 NOTE — Telephone Encounter (Signed)
I have spoke with patient and told him we do not refill pain medication post surgery and he would need to contact the surgery office.  Patient stated that he was told by Dr. Celine Ahr that after he was out of the pain meds he would have to get refills from PCP.    Patient has a refill request with PCP office and [facility].   I have left a message with [facility] to speak with them on this matter.

## 2020-07-16 ENCOUNTER — Other Ambulatory Visit: Payer: BC Managed Care – PPO

## 2020-07-16 ENCOUNTER — Other Ambulatory Visit: Payer: Self-pay | Admitting: Family Medicine

## 2020-07-16 DIAGNOSIS — I10 Essential (primary) hypertension: Secondary | ICD-10-CM

## 2020-07-16 NOTE — Telephone Encounter (Signed)
Requested Prescriptions  Pending Prescriptions Disp Refills  . triamterene-hydrochlorothiazide (MAXZIDE-25) 37.5-25 MG tablet [Pharmacy Med Name: TRIAMTERENE-HCTZ 37.5-25 MG TB] 90 tablet 1    Sig: TAKE 1 TABLET BY MOUTH EVERY DAY     Cardiovascular: Diuretic Combos Failed - 07/16/2020  1:31 AM      Failed - Na in normal range and within 360 days    Sodium  Date Value Ref Range Status  03/06/2020 132 (L) 135 - 146 mmol/L Final         Failed - Last BP in normal range    BP Readings from Last 1 Encounters:  07/10/20 (!) 152/90         Passed - K in normal range and within 360 days    Potassium  Date Value Ref Range Status  03/06/2020 3.8 3.5 - 5.3 mmol/L Final         Passed - Cr in normal range and within 360 days    Creat  Date Value Ref Range Status  03/06/2020 0.94 0.60 - 1.35 mg/dL Final         Passed - Ca in normal range and within 360 days    Calcium  Date Value Ref Range Status  03/06/2020 9.7 8.6 - 10.3 mg/dL Final         Passed - Valid encounter within last 6 months    Recent Outpatient Visits          3 weeks ago Chronic frontal sinusitis   [place], Devonne Doughty, DO   3 months ago Left inguinal hernia   [place], Devonne Doughty, DO   4 months ago Essential hypertension   [place], Devonne Doughty, DO   5 months ago Essential hypertension   [place], Devonne Doughty, DO             . amLODipine (NORVASC) 10 MG tablet [Pharmacy Med Name: AMLODIPINE BESYLATE 10 MG TAB] 90 tablet 1    Sig: TAKE 1 TABLET BY MOUTH EVERY DAY     Cardiovascular:  Calcium Channel Blockers Failed - 07/16/2020  1:31 AM      Failed - Last BP in normal range    BP Readings from Last 1 Encounters:  07/10/20 (!) 152/90         Passed - Valid encounter within last 6 months    Recent Outpatient Visits          3 weeks ago Chronic frontal sinusitis   [place], DO   3 months ago Left inguinal hernia   [place], Devonne Doughty, DO   4 months ago Essential hypertension   [place], Devonne Doughty, DO   5 months ago Essential hypertension   [place], Devonne Doughty, [place]

## 2020-07-21 ENCOUNTER — Telehealth: Payer: Self-pay | Admitting: General Surgery

## 2020-07-21 NOTE — Telephone Encounter (Signed)
Called patient and spoke to wife Butch Penny. Per Butch Penny, she states for the last 5 days pt has been having left testicle pain and has been getting worst within the last 2 days. Per wife, patient denies swelling or pain down the leg or to the right testicle. Patient is not taking anything for pain. Advised wife to tell patient to do the regimen that Dr Celine Ahr told him to do at his last visit for 2 days straight. To continue it even if he feels like its getting better. Advised wife to use ice packs to the area to help with the discomfort. Wife was pleased and will make pt aware. Wife understands to give office a call back if this does not help and pain gets worst.

## 2020-07-21 NOTE — Telephone Encounter (Signed)
Patient is calling and is complaining  of having soreness in his testicles said he just had surgery and is asking If one of the nurses could give him a call back. Please call patient and advise.

## 2020-07-21 NOTE — Telephone Encounter (Signed)
Said the best number to reach him at is [phone].

## 2020-07-24 ENCOUNTER — Ambulatory Visit (INDEPENDENT_AMBULATORY_CARE_PROVIDER_SITE_OTHER): Payer: BC Managed Care – PPO | Admitting: General Surgery

## 2020-07-24 ENCOUNTER — Other Ambulatory Visit: Payer: Self-pay

## 2020-07-24 ENCOUNTER — Encounter: Payer: Self-pay | Admitting: General Surgery

## 2020-07-24 ENCOUNTER — Encounter: Payer: BC Managed Care – PPO | Admitting: General Surgery

## 2020-07-24 VITALS — BP 166/103 | HR 63 | Temp 98.1°F | Ht 73.0 in | Wt 215.0 lb

## 2020-07-24 DIAGNOSIS — Z8719 Personal history of other diseases of the digestive system: Secondary | ICD-10-CM

## 2020-07-24 DIAGNOSIS — Z9889 Other specified postprocedural states: Secondary | ICD-10-CM

## 2020-07-24 MED ORDER — PREGABALIN 50 MG PO CAPS: 50.0000 mg | ORAL_CAPSULE | Freq: Three times a day (TID) | ORAL | 0 refills | Status: DC

## 2020-07-24 NOTE — Progress Notes (Signed)
Nathan Wade is here today for follow-up from his robot-assisted laparoscopic left inguinal hernia repair.  I saw him postop day 1 due to uncontrolled pain.  Lyrica was prescribed which helped tremendously.  About 5 days ago, he states that he began having pain in his left testicle.  It is not burning, nor does it radiate anywhere.  It is exacerbated with activity.  He has been alternating ibuprofen and Tylenol as recommended without significant relief.  He is out of Lyrica.  He has returned to work, but states that he is still on light duty.  He has not noticed any significant swelling.  He denies any fevers or chills.  No nausea or vomiting.  Today's Vitals   07/24/20 1136  BP: (!) 166/103  Pulse: 63  Temp: 98.1 F (36.7 C)  SpO2: 97%  Weight: 215 lb (97.5 kg)  Height: 6\' 1"  (1.854 m)   Body mass index is 28.37 kg/m.  Focused examination demonstrates that his laparoscopic port sites are healing nicely without erythema, induration, or purulent drainage present. Examination of the testicles demonstrates that there appears to be a small amount of fluid within the scrotal sac.  There is no evidence or concern for torsion.  No hematoma.  Impression and plan: This is a 49 year old man 3 weeks status post robot-assisted laparoscopic left inguinal hernia repair.  Overall, he continues to make good progress, but in the last 5 days, he has had pain in his left testicle.  No obvious testicular pathology on exam today, but he does appear to have some fluid in the scrotal sac which may be contributing to his discomfort.  I encouraged him to wear athletic compression shorts for support.  He was also cautioned that he needs to maintain his lifting and other activity restrictions for a total of 6 weeks from the time of surgery.  As Lyrica provided good pain relief for him in the past, I will refill his prior prescription.  For now, I plan to see him on an as-needed basis, but if he continues to have issues with  testicular discomfort, he may require an ultrasound and potentially referral to urology for further evaluation and management.

## 2020-07-24 NOTE — Patient Instructions (Addendum)
Start wearing compression shorts to minimize the movement.  We will refill you Lyrica to see if this helps you.  Give it some time for the fluid to reabsorb.  Call us in a few weeks if it does not improve.   We will see you back as needed.   Laparoscopic Inguinal Hernia Repair, Adult, Care After This sheet gives you information about how to care for yourself after your procedure. Your health care provider may also give you more specific instructions. If you have problems or questions, contact your health care provider. What can I expect after the procedure? After the procedure, it is common to have:  Pain.  Swelling and bruising around the incision area.  Scrotal swelling, in men.  Some fluid or blood draining from your incisions. Follow these instructions at home: Incision care  Follow instructions from your health care provider about how to take care of your incisions. Make sure you: ? Wash your hands with soap and water before you change your bandage (dressing). If soap and water are not available, use hand sanitizer. ? Change your dressing as told by your health care provider. ? Leave stitches (sutures), skin glue, or adhesive strips in place. These skin closures may need to stay in place for 2 weeks or longer. If adhesive strip edges start to loosen and curl up, you may trim the loose edges. Do not remove adhesive strips completely unless your health care provider tells you to do that.  Check your incision area every day for signs of infection. Check for: ? More redness, swelling, or pain. ? More fluid or blood. ? Warmth. ? Pus or a bad smell.  Wear loose, soft clothing while your incisions heal. Driving  Do not drive or use heavy machinery while taking prescription pain medicine.  Do not drive for 24 hours if you were given a medicine to help you relax (sedative) during your procedure. Activity  Do not lift anything that is heavier than 10 lb (4.5 kg), or the limit  that you are told, until your health care provider says that it is safe.  Ask your health care provider what activities are safe for you. A lot of activity during the first week after surgery can increase pain and swelling. For 1 week after your procedure: ? Avoid activities that take a lot of effort, such as exercise or sports. ? You may walk and climb stairs as needed for daily activity, but avoid long walks or climbing stairs for exercise. Managing pain and swelling   Put ice on painful or swollen areas: ? Put ice in a plastic bag. ? Place a towel between your skin and the bag. ? Leave the ice on for 20 minutes, 2-3 times a day. General instructions  Do not take baths, swim, or use a hot tub until your health care provider approves. Ask your health care provider if you may take showers. You may only be allowed to take sponge baths.  Take over-the-counter and prescription medicines only as told by your health care provider.  To prevent or treat constipation while you are taking prescription pain medicine, your health care provider may recommend that you: ? Drink enough fluid to keep your urine pale yellow. ? Take over-the-counter or prescription medicines. ? Eat foods that are high in fiber, such as fresh fruits and vegetables, whole grains, and beans. ? Limit foods that are high in fat and processed sugars, such as fried and sweet foods.  Do not use any  products that contain nicotine or tobacco, such as cigarettes and e-cigarettes. If you need help quitting, ask your health care provider.  Drink enough fluid to keep your urine pale yellow.  Keep all follow-up visits as told by your health care provider. This is important. Contact a health care provider if:  You have more redness, swelling, or pain around your incisions or your groin area.  You have more swelling in your scrotum.  You have more fluid or blood coming from your incisions.  Your incisions feel warm to the  touch.  You have severe pain and medicines do not help.  You have abdominal pain or swelling.  You cannot eat or drink without vomiting.  You cannot urinate or pass a bowel movement.  You faint.  You feel dizzy.  You have nausea and vomiting.  You have a fever. Get help right away if:  You have pus or a bad smell coming from your incisions.  You have redness, warmth, or pain in your leg.  You have chest pain.  You have problems breathing. Summary  Pain, swelling, and bruising are common after the procedure.  Check your incision area every day for signs of infection, such as more redness, swelling, or pain.  Put ice on painful or swollen areas for 20 minutes, 2-3 times a day. This information is not intended to replace advice given to you by your health care provider. Make sure you discuss any questions you have with your health care provider. Document Revised: 02/14/2019 Document Reviewed: 12/16/2016 Elsevier Patient Education  [place].

## 2020-07-31 ENCOUNTER — Encounter: Payer: BC Managed Care – PPO | Admitting: General Surgery

## 2020-09-04 ENCOUNTER — Encounter: Payer: BC Managed Care – PPO | Admitting: General Surgery

## 2020-09-05 ENCOUNTER — Telehealth: Payer: Self-pay

## 2020-09-05 ENCOUNTER — Encounter: Payer: Self-pay | Admitting: Family Medicine

## 2020-09-05 ENCOUNTER — Ambulatory Visit (INDEPENDENT_AMBULATORY_CARE_PROVIDER_SITE_OTHER): Payer: BC Managed Care – PPO | Admitting: Family Medicine

## 2020-09-05 DIAGNOSIS — J0110 Acute frontal sinusitis, unspecified: Secondary | ICD-10-CM | POA: Diagnosis not present

## 2020-09-05 MED ORDER — AMOXICILLIN-POT CLAVULANATE 875-125 MG PO TABS: 1.0000 | ORAL_TABLET | Freq: Two times a day (BID) | ORAL | 0 refills | Status: DC

## 2020-09-05 NOTE — Telephone Encounter (Signed)
Please notify patient try to do virtual visit right now instead of Monday. Cannot call in antibiotic without apt  Nobie Putnam, [facility] 09/05/2020, 11:24 AM

## 2020-09-05 NOTE — Telephone Encounter (Signed)
Copied from [place] [phone]. Topic: General - Other >> Sep 05, 2020  9:06 AM Yvette Rack wrote: Reason for CRM: Pt has appt on 09/08/20 and requests that a Rx for an antibiotic and nasal spray be sent to [place], [place]. Pt stated the sinus infection is getting bad and he would like to have medication over the weekend to help it from spreading to his lungs. Pt requests call back

## 2020-09-05 NOTE — Telephone Encounter (Signed)
Patient appt scheduled for 09/05/2020 around 11:20 am

## 2020-09-05 NOTE — Patient Instructions (Signed)
Start Augmentin antibiotic 10 days Use nasal sprays Please call [facility] to schedule apt from previous referral  Please schedule a Follow-up Appointment to: No follow-ups on file.  If you have any other questions or concerns, please feel free to call the office or send a message through [place]. You may also schedule an earlier appointment if necessary.  Additionally, you may be receiving a survey about your experience at our office within a few days to 1 week by e-mail or mail. We value your feedback.  Nobie Putnam, DO [place]

## 2020-09-05 NOTE — Progress Notes (Signed)
Virtual Visit via Telephone The purpose of this virtual visit is to provide medical care while limiting exposure to the novel coronavirus (COVID19) for both patient and office staff.  Consent was obtained for phone visit:  Yes.   Answered questions that patient had about telehealth interaction:  Yes.   I discussed the limitations, risks, security and privacy concerns of performing an evaluation and management service by telephone. I also discussed with the patient that there may be a patient responsible charge related to this service. The patient expressed understanding and agreed to proceed.  Patient Location: Home Provider Location: [facility] (Office)  Participants in virtual visit: - Patient: Nathan Wade - CMA: Frederich Cha, [place] - Provider: Dr Parks Ranger  ---------------------------------------------------------------------- Chief Complaint  Patient presents with  . Sinusitis    S: Reviewed CMA documentation. I have called patient and gathered additional HPI as follows:  Acute Sinusitis Reports that symptoms started within past 1 week onset, worsening now with worse pain pressure congestion clogged sinus and drainage into chest. - Tried OTC cold remedy, allergy meds, nasal sprays already on rx  He has recurrent sinus infections, last in 03/2020 and 06/2020, he was unable to see ENT last time referred  Denies any known or suspected exposure to person with or possibly with COVID19.  Admits congestion, ear pain pressure reduce hearing Denies any fevers, chills, sweats, body ache, shortness of breath, headache, abdominal pain, diarrhea  Past Medical History:  Diagnosis Date  . Hypertension    Social History   Tobacco Use  . Smoking status: Former Smoker    Packs/day: 0.50    Years: 4.00    Pack years: 2.00    Types: Cigarettes    Quit date: 2012    Years since quitting: 9.9  . Smokeless tobacco: Never Used  Vaping Use  . Vaping Use: Never used   Substance Use Topics  . Alcohol use: Yes  . Drug use: Not Currently    Current Outpatient Medications:  .  amLODipine (NORVASC) 10 MG tablet, TAKE 1 TABLET BY MOUTH EVERY DAY, Disp: 90 tablet, Rfl: 1 .  atorvastatin (LIPITOR) 10 MG tablet, Take 1 tablet (10 mg total) by mouth daily. (Patient taking differently: Take 10 mg by mouth at bedtime.), Disp: 90 tablet, Rfl: 1 .  CREATINE PO, Take 1 Scoop by mouth daily., Disp: , Rfl:  .  ibuprofen (ADVIL) 800 MG tablet, Take 1 tablet (800 mg total) by mouth every 8 (eight) hours as needed., Disp: 30 tablet, Rfl: 0 .  Multiple Vitamin (MULTIVITAMIN WITH MINERALS) TABS tablet, Take 1 tablet by mouth daily., Disp: , Rfl:  .  sildenafil (REVATIO) 20 MG tablet, Take 1-5 pills about 30 min prior to sex. Start with 1-2 and increase as needed. (Patient taking differently: Take 20-100 mg by mouth daily as needed (30 mins prior to intercourse).), Disp: 100 tablet, Rfl: 2 .  tadalafil (CIALIS) 20 MG tablet, Take 1 tablet (20 mg total) by mouth every other day as needed for erectile dysfunction., Disp: 30 tablet, Rfl: 3 .  triamterene-hydrochlorothiazide (MAXZIDE-25) 37.5-25 MG tablet, Take 1 tablet by mouth daily., Disp: 90 tablet, Rfl: 1 .  amoxicillin-clavulanate (AUGMENTIN) 875-125 MG tablet, Take 1 tablet by mouth 2 (two) times daily., Disp: 20 tablet, Rfl: 0 .  pregabalin (LYRICA) 50 MG capsule, Take 1 capsule (50 mg total) by mouth 3 (three) times daily for 5 days., Disp: 15 capsule, Rfl: 0  Depression screen [facility] 2/9 02/22/2020 02/01/2020  Decreased  Interest 0 0  Down, Depressed, Hopeless 0 0  PHQ - 2 Score 0 0    No flowsheet data found.  -------------------------------------------------------------------------- O: No physical exam performed due to remote telephone encounter.  Lab results reviewed.  Recent Results (from the past 2160 hour(s))  SARS CORONAVIRUS 2 (TAT 6-24 HRS) Nasopharyngeal Nasopharyngeal Swab     Status: None   Collection Time:  07/07/20  8:55 AM   Specimen: Nasopharyngeal Swab  Result Value Ref Range   SARS Coronavirus 2 NEGATIVE NEGATIVE    Comment: (NOTE) SARS-CoV-2 target nucleic acids are NOT DETECTED.  The SARS-CoV-2 RNA is generally detectable in upper and lower respiratory specimens during the acute phase of infection. Negative results do not preclude SARS-CoV-2 infection, do not rule out co-infections with other pathogens, and should not be used as the sole basis for treatment or other patient management decisions. Negative results must be combined with clinical observations, patient history, and epidemiological information. The expected result is Negative.  Fact Sheet for Patients: SugarRoll.be  Fact Sheet for Healthcare Providers: https://www.woods-mathews.com/  This test is not yet approved or cleared by the [place] FDA and  has been authorized for detection and/or diagnosis of SARS-CoV-2 by FDA under an Emergency Use Authorization (EUA). This EUA will remain  in effect (meaning this test can be used) for the duration of the COVID-19 declaration under Se ction 564(b)(1) of the Act, 21 U.S.C. section 360bbb-3(b)(1), unless the authorization is terminated or revoked sooner.  Performed at [facility], [place] [address] [place], [place] [address]     -------------------------------------------------------------------------- A&P:  Problem List Items Addressed This Visit   None   Visit Diagnoses    Acute non-recurrent frontal sinusitis    -  Primary   Relevant Medications   amoxicillin-clavulanate (AUGMENTIN) 875-125 MG tablet       #Recurrent sinusitis, chronic Last treated 03/2020 and 06/2020 Already on Flonase, Atrovent  Start Augmentin course 10 days  Continue nasal sprays / allergy therapy  Previously referred to [facility]NT 06/2020 chronic recurrent sinusitis, multiple times per year, with thicker sinus congestion  drainage, interested in sinus surgery options if available, would be candidate for sinus imaging as well  He did not follow-up and schedule this ENT apt, he had to cancel/reschedule but now he plans to pursue it soon.  Meds ordered this encounter  Medications  . amoxicillin-clavulanate (AUGMENTIN) 875-125 MG tablet    Sig: Take 1 tablet by mouth 2 (two) times daily.    Dispense:  20 tablet    Refill:  0    Follow-up: - Return in 1 week as needed, walk in flu shot  Patient verbalizes understanding with the above medical recommendations including the limitation of remote medical advice.  Specific follow-up and call-back criteria were given for patient to follow-up or seek medical care more urgently if needed.   - Time spent in direct consultation with patient on phone: 7 minutes   Nobie Putnam, [facility] 09/05/2020, 11:37 AM

## 2020-09-08 ENCOUNTER — Ambulatory Visit: Payer: BC Managed Care – PPO | Admitting: Family Medicine

## 2020-09-09 ENCOUNTER — Ambulatory Visit (INDEPENDENT_AMBULATORY_CARE_PROVIDER_SITE_OTHER): Payer: BC Managed Care – PPO | Admitting: General Surgery

## 2020-09-09 ENCOUNTER — Other Ambulatory Visit: Payer: Self-pay

## 2020-09-09 ENCOUNTER — Encounter: Payer: Self-pay | Admitting: General Surgery

## 2020-09-09 VITALS — BP 156/83 | HR 81 | Temp 98.9°F | Ht 73.0 in | Wt 215.2 lb

## 2020-09-09 DIAGNOSIS — N50812 Left testicular pain: Secondary | ICD-10-CM

## 2020-09-09 NOTE — Patient Instructions (Addendum)
Dr Celine Ahr advised patient to try over the counter Tylenol or Ibuprofen to help with any inflammation or discomfort. Patient will have Scrotum Ultrasound at [facility] on 09/17/20 arrive at 9:45am for scan at 10:00am.  Scrotal Swelling Scrotal swelling refers to a condition in which the sac of skin that contains the testes (scrotum) is enlarged or swollen. Many things can cause the scrotum to enlarge or swell, including:  Fluid around the testicle (hydrocele).  A weakened area in the muscles around the groin (hernia).  An enlarged vein around the testicle (varicocele).  An injury.  An infection.  Certain medical treatments.  Certain medical conditions, such as congestive heart failure.  A recent genital surgery or procedure.  A twisting of the spermatic cord that cuts off blood supply (testicular torsion).  Testicular cancer. Scrotal swelling can happen along with scrotal pain. Follow these instructions at home:  Until the swelling goes away: ? Rest. The best position to rest in is to lie down. ? Limit activity.  Put ice on the scrotum: ? Put ice in a plastic bag. ? Place a towel between your skin and the bag. ? Leave the ice on for 20 minutes, 2-3 times a day for 1-2 days.  Place a rolled towel under your testicles for support.  Wear loose-fitting clothing or an athletic support cup for comfort.  Take over-the-counter and prescription medicines only as told by your health care provider.  Perform a monthly self-exam of the scrotum and penis. Feel for changes. Ask your health care provider how to perform a monthly self-exam if you are unsure. Contact a health care provider if:  You have a sudden pain that is persistent and does not improve.  You have a heavy feeling or notice fluid in the scrotum.  You have pain or burning while urinating.  You have blood in your urine or semen.  You feel a lump around the testicle.  You notice that one testicle is larger  than the other. Keep in mind that a small difference in size is normal.  You have a persistent dull ache or pain in your groin or scrotum. Get help right away if:  The pain does not go away.  The pain becomes severe.  You have a fever or chills.  You have pain or vomiting that cannot be controlled.  One or both sides of the scrotum are very red and swollen.  There is redness spreading upward from your scrotum to your abdomen or downward from your scrotum to your thighs. Summary  Scrotal swelling refers to a condition in which the sac of skin that contains the testes (scrotum) is enlarged.  Many things can cause the scrotum to swell, including hydrocele, a hernia, and a varicocele.  Limiting activity and icing the scrotum may help reduce swelling and pain.  Contact your health care provider if you develop scrotal pain that is sudden and persistent, or if you have pain while urinating. Do this also if you feel a lump around the testicle or notice blood in your urine or semen.  Get help right away for uncontrolled pain or vomiting, for very red and swollen scrotum, or for fever or chills. This information is not intended to replace advice given to you by your health care provider. Make sure you discuss any questions you have with your health care provider. Document Revised: 08/19/2017 Document Reviewed: 11/22/2016 Elsevier Patient Education  [place].

## 2020-09-09 NOTE — Progress Notes (Signed)
Nathan Wade is here today for a recheck after undergoing a robot-assisted laparoscopic left inguinal hernia repair on July 09, 2020.  On postop day 1, he had uncontrolled pain which responded well to Lyrica.  I last saw him on July 24, 2020, when he complained of pain in the left testicle.  At that visit, he had a modest amount of fluid within the scrotal sac without concern for torsion.  No overt hematoma.  I advised him to continue to wear supportive undergarments but that if he continued to have issues with testicular discomfort, he should contact our office.  He states that over the past 1 to 2 weeks, the discomfort has improved significantly, but it is still present and he is concerned.  He denies any burning sensation or hypersensitivity to the skin; he says it is primarily a dull throb that he notices most when he lifts or adjusts his testicles.  No pain with urination, no hematuria.  No fevers or chills.  No nausea or vomiting.  Today's Vitals   09/09/20 1021  BP: (!) 156/83  Pulse: 81  Temp: 98.9 F (37.2 C)  TempSrc: Oral  SpO2: 97%  Weight: 215 lb 3.2 oz (97.6 kg)  Height: 6\' 1"  (1.854 m)  PainSc: 0-No pain  PainLoc: Scrotum   Body mass index is 28.39 kg/m. Focused examination demonstrates no testicular mass palpable, no significant fluid in the scrotal sac, no hematoma.  He endorses some tenderness as I manipulate the left testicle.  No swelling of the epididymis appreciated and no concern for torsion.  Impression and plan: This is a 49 year old man who had a robot-assisted left inguinal hernia repair.  He has had some persistent left testicular discomfort, although it is improving.  He is concerned, however.  We will order a scrotal ultrasound to evaluate the area.  In the meantime, I recommended that he take 600 mg of ibuprofen every 6 hours around-the-clock for the next 3 days to see if this helps alleviate some of the inflammation.  I will contact him with the results of the  ultrasound and make further plans based upon those findings.

## 2020-09-10 ENCOUNTER — Telehealth: Payer: BC Managed Care – PPO | Admitting: Family Medicine

## 2020-09-17 ENCOUNTER — Ambulatory Visit: Admission: RE | Admit: 2020-09-17 | Payer: BC Managed Care – PPO | Source: Ambulatory Visit

## 2020-09-22 ENCOUNTER — Encounter: Payer: Self-pay | Admitting: General Surgery

## 2020-09-26 ENCOUNTER — Telehealth: Payer: Self-pay | Admitting: General Surgery

## 2020-09-26 ENCOUNTER — Ambulatory Visit: Admission: RE | Admit: 2020-09-26 | Payer: BC Managed Care – PPO | Source: Ambulatory Visit

## 2020-09-26 NOTE — Telephone Encounter (Signed)
Imaging called, patient was scheduled for his scrotal ultrasound today and was a no show.

## 2020-09-26 NOTE — Telephone Encounter (Signed)
Left detailed message for patient to give our office a call back.

## 2020-10-01 ENCOUNTER — Telehealth: Payer: Self-pay

## 2020-10-01 DIAGNOSIS — J329 Chronic sinusitis, unspecified: Secondary | ICD-10-CM

## 2020-10-01 MED ORDER — IPRATROPIUM BROMIDE 0.06 % NA SOLN: 2.0000 | Freq: Four times a day (QID) | NASAL | 0 refills | Status: DC

## 2020-10-01 MED ORDER — IPRATROPIUM BROMIDE 0.06 % NA SOLN: 2.0000 | Freq: Four times a day (QID) | NASAL | 3 refills | Status: DC

## 2020-10-01 MED ORDER — AMOXICILLIN-POT CLAVULANATE 875-125 MG PO TABS: 1.0000 | ORAL_TABLET | Freq: Two times a day (BID) | ORAL | 0 refills | Status: DC

## 2020-10-01 NOTE — Telephone Encounter (Signed)
Called patient back.  Since short notice, already seen for this earlier this year, he understands needs to schedule w/ ENT. I agree a one time offer repeat same rx that worked augmentin and atrovent nasal, advised he should schedule virtual visit for apt next time.  Nobie Putnam, [facility] 10/01/2020, 6:06 PM

## 2020-10-01 NOTE — Telephone Encounter (Signed)
Patient called again requesting to be called back at [phone] when possible. Patient is concerned that he may miss work and would like to avoid that if possible.

## 2020-10-01 NOTE — Telephone Encounter (Signed)
Copied from [place] [phone]. Topic: General - Other >> Oct 01, 2020 10:12 AM Rainey Pines A wrote: Patient is requesting a message be sent to pcp in regards to his still having the cold shills and congestion he was recently seen for and wants to know if Dr. Raliegh Ip can send in some more nasal spray and antibiotic. Patient would like a callback from nurse once medication has been sent to pharmacy. >> Oct 01, 2020  1:17 PM Lennox Solders wrote: Pt is calling checking on the status of message and abx

## 2020-11-16 ENCOUNTER — Other Ambulatory Visit: Payer: Self-pay | Admitting: Family Medicine

## 2020-11-16 DIAGNOSIS — E7800 Pure hypercholesterolemia, unspecified: Secondary | ICD-10-CM

## 2020-11-16 NOTE — Telephone Encounter (Signed)
Requested Prescriptions  Pending Prescriptions Disp Refills  . atorvastatin (LIPITOR) 10 MG tablet [Pharmacy Med Name: Atorvastatin Calcium 10 MG Oral Tablet] 90 tablet 0    Sig: Take 1 tablet by mouth once daily     Cardiovascular:  Antilipid - Statins Passed - 11/16/2020 12:41 PM      Passed - Total Cholesterol in normal range and within 360 days    Cholesterol  Date Value Ref Range Status  02/15/2020 139 <200 mg/dL Final         Passed - LDL in normal range and within 360 days    LDL Cholesterol (Calc)  Date Value Ref Range Status  02/15/2020 59 mg/dL (calc) Final    Comment:    Reference range: <100 . Desirable range <100 mg/dL for primary prevention;   <70 mg/dL for patients with CHD or diabetic patients  with > or = 2 CHD risk factors. Marland Kitchen LDL-C is now calculated using the Martin-Hopkins  calculation, which is a validated novel method providing  better accuracy than the Friedewald equation in the  estimation of LDL-C.  Cresenciano Genre et al. Annamaria Helling. 1610;960(45): 2061-2068  (http://education.[employer].com/faq/FAQ164)          Passed - HDL in normal range and within 360 days    HDL  Date Value Ref Range Status  02/15/2020 66 > OR = 40 mg/dL Final         Passed - Triglycerides in normal range and within 360 days    Triglycerides  Date Value Ref Range Status  02/15/2020 59 <150 mg/dL Final         Passed - Patient is not pregnant      Passed - Valid encounter within last 12 months    Recent Outpatient Visits          2 months ago Acute non-recurrent frontal sinusitis   [facility] [place], Devonne Doughty, DO   4 months ago Chronic frontal sinusitis   [facility] [place], Devonne Doughty, DO   7 months ago Left inguinal hernia   [place], Devonne Doughty, DO   8 months ago Essential hypertension   [facility] Olin Hauser, DO   9 months ago Essential hypertension   [place], Devonne Doughty, [place]

## 2020-12-19 ENCOUNTER — Ambulatory Visit: Payer: BC Managed Care – PPO | Admitting: Family Medicine

## 2020-12-19 ENCOUNTER — Other Ambulatory Visit: Payer: Self-pay | Admitting: Family Medicine

## 2020-12-19 ENCOUNTER — Other Ambulatory Visit: Payer: Self-pay

## 2020-12-19 ENCOUNTER — Encounter: Payer: Self-pay | Admitting: Family Medicine

## 2020-12-19 VITALS — BP 143/79 | HR 67 | Ht 73.5 in | Wt 210.4 lb

## 2020-12-19 DIAGNOSIS — Z125 Encounter for screening for malignant neoplasm of prostate: Secondary | ICD-10-CM

## 2020-12-19 DIAGNOSIS — R7309 Other abnormal glucose: Secondary | ICD-10-CM

## 2020-12-19 DIAGNOSIS — I10 Essential (primary) hypertension: Secondary | ICD-10-CM

## 2020-12-19 DIAGNOSIS — Z114 Encounter for screening for human immunodeficiency virus [HIV]: Secondary | ICD-10-CM

## 2020-12-19 DIAGNOSIS — E7800 Pure hypercholesterolemia, unspecified: Secondary | ICD-10-CM

## 2020-12-19 DIAGNOSIS — Z1211 Encounter for screening for malignant neoplasm of colon: Secondary | ICD-10-CM | POA: Diagnosis not present

## 2020-12-19 DIAGNOSIS — J329 Chronic sinusitis, unspecified: Secondary | ICD-10-CM | POA: Diagnosis not present

## 2020-12-19 DIAGNOSIS — Z1159 Encounter for screening for other viral diseases: Secondary | ICD-10-CM

## 2020-12-19 DIAGNOSIS — Z0000 Encounter for general adult medical examination without abnormal findings: Secondary | ICD-10-CM

## 2020-12-19 MED ORDER — IPRATROPIUM BROMIDE 0.06 % NA SOLN: 2.0000 | Freq: Four times a day (QID) | NASAL | 3 refills | Status: DC

## 2020-12-19 MED ORDER — AMOXICILLIN-POT CLAVULANATE 875-125 MG PO TABS: 1.0000 | ORAL_TABLET | Freq: Two times a day (BID) | ORAL | 0 refills | Status: DC

## 2020-12-19 NOTE — Progress Notes (Signed)
Subjective:    Patient ID: Nathan Wade, male    DOB: 07-23-1971, 50 y.o.   MRN: [id number]  Nathan Wade is a 50 y.o. male presenting on 12/19/2020 for Sinusitis   HPI   Acute Sinusitis Reports symptoms started >3 days ago, similar to prior recurrent sinusitis, he was referred to ENT previously 06/2020 unable to schedule, he has had complicated sinusitis in past, previously success with augmentin and atrovent nasal. He may call ENT to reschedule. Admits allergen exposure with environmental allergies. Denies fever chills   Health Maintenance:  Colon CA Screening: Never had colonoscopy. Currently asymptomatic. No known family history of colon CA. Due for screening test new referral to GI for colonoscopy   Depression screen [facility] 2/9 02/22/2020 02/01/2020  Decreased Interest 0 0  Down, Depressed, Hopeless 0 0  PHQ - 2 Score 0 0    Social History   Tobacco Use  . Smoking status: Former Smoker    Packs/day: 0.50    Years: 4.00    Pack years: 2.00    Types: Cigarettes    Quit date: 2012    Years since quitting: 10.2  . Smokeless tobacco: Never Used  Vaping Use  . Vaping Use: Never used  Substance Use Topics  . Alcohol use: Yes  . Drug use: Not Currently    Review of Systems Per HPI unless specifically indicated above     Objective:    BP (!) 143/79   Pulse 67   Ht 6' 1.5" (1.867 m)   Wt 210 lb 6.4 oz (95.4 kg)   SpO2 99%   BMI 27.38 kg/m   Wt Readings from Last 3 Encounters:  12/19/20 210 lb 6.4 oz (95.4 kg)  09/09/20 215 lb 3.2 oz (97.6 kg)  07/24/20 215 lb (97.5 kg)    Physical Exam Vitals and nursing note reviewed.  Constitutional:      General: He is not in acute distress.    Appearance: He is well-developed. He is not diaphoretic.     Comments: Well-appearing, comfortable, cooperative  HENT:     Head: Normocephalic and atraumatic.     Nose: Congestion present.  Eyes:     General:        Right eye: No discharge.        Left eye: No discharge.      Conjunctiva/sclera: Conjunctivae normal.  Cardiovascular:     Rate and Rhythm: Normal rate.  Pulmonary:     Effort: Pulmonary effort is normal.  Skin:    General: Skin is warm and dry.     Findings: No erythema or rash.  Neurological:     Mental Status: He is alert and oriented to person, place, and time.  Psychiatric:        Behavior: Behavior normal.     Comments: Well groomed, good eye contact, normal speech and thoughts    Results for orders placed or performed during the hospital encounter of 07/07/20  SARS CORONAVIRUS 2 (TAT 6-24 HRS) Nasopharyngeal Nasopharyngeal Swab   Specimen: Nasopharyngeal Swab  Result Value Ref Range   SARS Coronavirus 2 NEGATIVE NEGATIVE      Assessment & Plan:   Problem List Items Addressed This Visit   None   Visit Diagnoses    Recurrent sinusitis    -  Primary   Relevant Medications   ipratropium (ATROVENT) 0.06 % nasal spray   amoxicillin-clavulanate (AUGMENTIN) 875-125 MG tablet   Screening for colon cancer  Relevant Orders   Ambulatory referral to Gastroenterology      #Sinusitis, recurrent Current episode likely new episode of sinusitis Prior multiple flares within each year Previously referred to ENT but he was unable to schedule back in 06/2020 Repeat course of Atrovent + Augmentin for sinusitis Continue OTC regimen Follow-up w/ ENT if not improving  #Screening colonoscopy ordered.  Orders Placed This Encounter  Procedures  . Ambulatory referral to Gastroenterology    Referral Priority:   Routine    Referral Type:   Consultation    Referral Reason:   Specialty Services Required    Number of Visits Requested:   1     Meds ordered this encounter  Medications  . ipratropium (ATROVENT) 0.06 % nasal spray    Sig: Place 2 sprays into both nostrils 4 (four) times daily.    Dispense:  15 mL    Refill:  3  . amoxicillin-clavulanate (AUGMENTIN) 875-125 MG tablet    Sig: Take 1 tablet by mouth 2 (two) times daily.     Dispense:  20 tablet    Refill:  0    Follow up plan: Return in about 2 months (around 02/18/2021) for 2 month fasting lab only then 1 week later Annual Physical (?Shingles shot).  Orders for 03/09/21  Nobie Putnam, [facility] 12/19/2020, 8:54 AM

## 2020-12-19 NOTE — Patient Instructions (Addendum)
Thank you for coming to the office today.  Referral to GI for colonoscopy  [facility]) [address] [place], [place] [address] Phone: [phone]  They will call you to setup/schedule  ----------------  DUE for FASTING BLOOD WORK (no food or drink after midnight before the lab appointment, only water or coffee without cream/sugar on the morning of)  SCHEDULE "Lab Only" visit in the morning at the clinic for lab draw in 2 MONTHS   - Make sure Lab Only appointment is at about 1 week before your next appointment, so that results will be available  For Lab Results, once available within 2-3 days of blood draw, you can can log in to MyChart online to view your results and a brief explanation. Also, we can discuss results at next follow-up visit.   Please schedule a Follow-up Appointment to: Return in about 2 months (around 02/18/2021) for 2 month fasting lab only then 1 week later Annual Physical (?Shingles shot).  If you have any other questions or concerns, please feel free to call the office or send a message through [place]. You may also schedule an earlier appointment if necessary.  Additionally, you may be receiving a survey about your experience at our office within a few days to 1 week by e-mail or mail. We value your feedback.  Nobie Putnam, DO [facility], [facility]  Zoster Vaccine, Recombinant injection What is this medicine? ZOSTER VACCINE (ZOS ter vak SEEN) is a vaccine used to reduce the risk of getting shingles. This vaccine is not used to treat shingles or nerve pain from shingles. This medicine may be used for other purposes; ask your health care provider or pharmacist if you have questions. COMMON BRAND NAME(S): [facility] What should I tell my health care provider before I take this medicine? They need to know if you have any of these conditions:  cancer  immune system problems  an unusual or  allergic reaction to Zoster vaccine, other medications, foods, dyes, or preservatives  pregnant or trying to get pregnant  breast-feeding How should I use this medicine? This vaccine is injected into a muscle. It is given by a health care provider. A copy of Vaccine Information Statements will be given before each vaccination. Be sure to read this information carefully each time. This sheet may change often. Talk to your health care provider about the use of this vaccine in children. This vaccine is not approved for use in children. Overdosage: If you think you have taken too much of this medicine contact a poison control center or emergency room at once. NOTE: This medicine is only for you. Do not share this medicine with others. What if I miss a dose? Keep appointments for follow-up (booster) doses. It is important not to miss your dose. Call your health care provider if you are unable to keep an appointment. What may interact with this medicine?  medicines that suppress your immune system  medicines to treat cancer  steroid medicines like prednisone or cortisone This list may not describe all possible interactions. Give your health care provider a list of all the medicines, herbs, non-prescription drugs, or dietary supplements you use. Also tell them if you smoke, drink alcohol, or use illegal drugs. Some items may interact with your medicine. What should I watch for while using this medicine? Visit your health care provider regularly. This vaccine, like all vaccines, may not fully protect everyone. What side effects may I notice from  receiving this medicine? Side effects that you should report to your doctor or health care professional as soon as possible:  allergic reactions (skin rash, itching or hives; swelling of the face, lips, or tongue)  trouble breathing Side effects that usually do not require medical attention (report these to your doctor or health care professional if  they continue or are bothersome):  chills  headache  fever  nausea  pain, redness, or irritation at site where injected  tiredness  vomiting This list may not describe all possible side effects. Call your doctor for medical advice about side effects. You may report side effects to FDA at 1-800-FDA-1088. Where should I keep my medicine? This vaccine is only given by a health care provider. It will not be stored at home. NOTE: This sheet is a summary. It may not cover all possible information. If you have questions about this medicine, talk to your doctor, pharmacist, or health care provider.  2021 Elsevier/Gold Standard (2019-10-12 16:23:07)

## 2021-01-06 ENCOUNTER — Telehealth: Payer: Self-pay

## 2021-01-06 ENCOUNTER — Other Ambulatory Visit: Payer: Self-pay

## 2021-01-06 DIAGNOSIS — Z1211 Encounter for screening for malignant neoplasm of colon: Secondary | ICD-10-CM

## 2021-01-06 MED ORDER — NA SULFATE-K SULFATE-MG SULF 17.5-3.13-1.6 GM/177ML PO SOLN
1.0000 | Freq: Once | ORAL | 0 refills | Status: AC
Start: 2021-01-06 — End: 2021-01-06

## 2021-01-06 NOTE — Telephone Encounter (Signed)
Gastroenterology Pre-Procedure Review  Request Date: Monday 01/19/21 Requesting Physician: Dr. Marius Ditch  PATIENT REVIEW QUESTIONS: The patient responded to the following health history questions as indicated:    1. Are you having any GI issues? gas  2. Do you have a personal history of Polyps? no 3. Do you have a family history of Colon Cancer or Polyps? yes (dad colon polyps) 4. Diabetes Mellitus? no 5. Joint replacements in the past 12 months?no 6. Major health problems in the past 3 months?no 7. Any artificial heart valves, MVP, or defibrillator?no    MEDICATIONS & ALLERGIES:    Patient reports the following regarding taking any anticoagulation/antiplatelet therapy:   Plavix, Coumadin, Eliquis, Xarelto, Lovenox, Pradaxa, Brilinta, or Effient? no Aspirin? no  Patient confirms/reports the following medications:  Current Outpatient Medications  Medication Sig Dispense Refill  . amLODipine (NORVASC) 10 MG tablet TAKE 1 TABLET BY MOUTH EVERY DAY 90 tablet 1  . amoxicillin-clavulanate (AUGMENTIN) 875-125 MG tablet Take 1 tablet by mouth 2 (two) times daily. 20 tablet 0  . atorvastatin (LIPITOR) 10 MG tablet Take 1 tablet by mouth once daily 90 tablet 0  . CREATINE PO Take 1 Scoop by mouth daily.    Marland Kitchen ibuprofen (ADVIL) 800 MG tablet Take 1 tablet (800 mg total) by mouth every 8 (eight) hours as needed. 30 tablet 0  . ipratropium (ATROVENT) 0.06 % nasal spray Place 2 sprays into both nostrils 4 (four) times daily. 15 mL 3  . Multiple Vitamin (MULTIVITAMIN WITH MINERALS) TABS tablet Take 1 tablet by mouth daily.    . Na Sulfate-K Sulfate-Mg Sulf 17.5-3.13-1.6 GM/177ML SOLN Take 1 kit by mouth once for 1 dose. 354 mL 0  . sildenafil (REVATIO) 20 MG tablet Take 1-5 pills about 30 min prior to sex. Start with 1-2 and increase as needed. (Patient taking differently: Take 20-100 mg by mouth daily as needed (30 mins prior to intercourse).) 100 tablet 2  . tadalafil (CIALIS) 20 MG tablet Take 1  tablet (20 mg total) by mouth every other day as needed for erectile dysfunction. 30 tablet 3  . triamterene-hydrochlorothiazide (MAXZIDE-25) 37.5-25 MG tablet Take 1 tablet by mouth daily. 90 tablet 1  . pregabalin (LYRICA) 50 MG capsule Take 1 capsule (50 mg total) by mouth 3 (three) times daily for 5 days. 15 capsule 0   No current facility-administered medications for this visit.    Patient confirms/reports the following allergies:  No Known Allergies  No orders of the defined types were placed in this encounter.   AUTHORIZATION INFORMATION Primary Insurance: 1D#: Group #:  Secondary Insurance: 1D#: Group #:  SCHEDULE INFORMATION: Date: 01/19/21 Time: Location:[facility]

## 2021-01-08 ENCOUNTER — Telehealth: Payer: Self-pay

## 2021-01-08 ENCOUNTER — Other Ambulatory Visit: Payer: Self-pay

## 2021-01-08 DIAGNOSIS — Z1211 Encounter for screening for malignant neoplasm of colon: Secondary | ICD-10-CM

## 2021-01-08 NOTE — Telephone Encounter (Signed)
Returned patients call to reschedule colonoscopy.  Colonoscopy rescheduled to 03/04/21.  Instructions updated in mychart.  LVM with Endoscopy to notify of reschedule date.  Thanks,  [place], [place]

## 2021-01-24 ENCOUNTER — Other Ambulatory Visit: Payer: Self-pay | Admitting: Family Medicine

## 2021-01-24 DIAGNOSIS — I10 Essential (primary) hypertension: Secondary | ICD-10-CM

## 2021-01-24 NOTE — Telephone Encounter (Signed)
Last OV that addresses HTN/meds 02/22/20. Pt has upcoming appt. Refilled with enough med to last until upcoming visit. Requested Prescriptions  Pending Prescriptions Disp Refills  . triamterene-hydrochlorothiazide (MAXZIDE-25) 37.5-25 MG tablet [Pharmacy Med Name: Triamterene-HCTZ 37.5-25 MG Oral Tablet] 48 tablet 0    Sig: Take 1 tablet by mouth once daily     Cardiovascular: Diuretic Combos Failed - 01/24/2021  2:37 PM      Failed - Na in normal range and within 360 days    Sodium  Date Value Ref Range Status  03/06/2020 132 (L) 135 - 146 mmol/L Final         Failed - Last BP in normal range    BP Readings from Last 1 Encounters:  12/19/20 (!) 143/79         Passed - K in normal range and within 360 days    Potassium  Date Value Ref Range Status  03/06/2020 3.8 3.5 - 5.3 mmol/L Final         Passed - Cr in normal range and within 360 days    Creat  Date Value Ref Range Status  03/06/2020 0.94 0.60 - 1.35 mg/dL Final         Passed - Ca in normal range and within 360 days    Calcium  Date Value Ref Range Status  03/06/2020 9.7 8.6 - 10.3 mg/dL Final         Passed - Valid encounter within last 6 months    Recent Outpatient Visits          1 month ago Recurrent sinusitis   [facility] [place], Devonne Doughty, DO   4 months ago Acute non-recurrent frontal sinusitis   [facility] Olin Hauser, DO   7 months ago Chronic frontal sinusitis   [facility] Olin Hauser, DO   10 months ago Left inguinal hernia   [facility] Olin Hauser, DO   11 months ago Essential hypertension   [place], Devonne Doughty, DO      Future Appointments            In 1 month [place], Devonne Doughty, DO [facility], [facility]           . amLODipine ([place]) 10 MG tablet [Pharmacy Med Name: amLODIPine Besylate 10 MG Oral Tablet] 48 tablet 0    Sig:  Take 1 tablet by mouth once daily     Cardiovascular:  Calcium Channel Blockers Failed - 01/24/2021  2:37 PM      Failed - Last BP in normal range    BP Readings from Last 1 Encounters:  12/19/20 (!) 143/79         Passed - Valid encounter within last 6 months    Recent Outpatient Visits          1 month ago Recurrent sinusitis   [place], DO   4 months ago Acute non-recurrent frontal sinusitis   [place], DO   7 months ago Chronic frontal sinusitis   [place], DO   10 months ago Left inguinal hernia   [place], DO   11 months ago Essential hypertension   [place], Devonne Doughty, DO      Future Appointments  In 1 month [place], [facility], [facility]

## 2021-01-25 ENCOUNTER — Other Ambulatory Visit: Payer: Self-pay | Admitting: Family Medicine

## 2021-01-25 DIAGNOSIS — E7800 Pure hypercholesterolemia, unspecified: Secondary | ICD-10-CM

## 2021-01-25 NOTE — Telephone Encounter (Signed)
Requested Prescriptions  Pending Prescriptions Disp Refills  . atorvastatin (LIPITOR) 10 MG tablet [Pharmacy Med Name: Atorvastatin Calcium 10 MG Oral Tablet] 30 tablet 0    Sig: Take 1 tablet by mouth once daily     Cardiovascular:  Antilipid - Statins Passed - 01/25/2021 10:59 AM      Passed - Total Cholesterol in normal range and within 360 days    Cholesterol  Date Value Ref Range Status  02/15/2020 139 <200 mg/dL Final         Passed - LDL in normal range and within 360 days    LDL Cholesterol (Calc)  Date Value Ref Range Status  02/15/2020 59 mg/dL (calc) Final    Comment:    Reference range: <100 . Desirable range <100 mg/dL for primary prevention;   <70 mg/dL for patients with CHD or diabetic patients  with > or = 2 CHD risk factors. Marland Kitchen LDL-C is now calculated using the Martin-Hopkins  calculation, which is a validated novel method providing  better accuracy than the Friedewald equation in the  estimation of LDL-C.  Cresenciano Genre et al. Annamaria Helling. 3500;938(18): 2061-2068  (http://education.Quest[employer]ostics.com/faq/FAQ164)          Passed - HDL in normal range and within 360 days    HDL  Date Value Ref Range Status  02/15/2020 66 > OR = 40 mg/dL Final         Passed - Triglycerides in normal range and within 360 days    Triglycerides  Date Value Ref Range Status  02/15/2020 59 <150 mg/dL Final         Passed - Patient is not pregnant      Passed - Valid encounter within last 12 months    Recent Outpatient Visits          1 month ago Recurrent sinusitis   [place], DO   4 months ago Acute non-recurrent frontal sinusitis   [facility] Olin Hauser, DO   7 months ago Chronic frontal sinusitis   [facility] Olin Hauser, DO   10 months ago Left inguinal hernia   [facility] Olin Hauser, DO   11 months ago Essential hypertension   [place], Devonne Doughty, DO      Future Appointments            In 1 month [place], Devonne Doughty, [facility], [facility]

## 2021-02-19 ENCOUNTER — Telehealth: Payer: Self-pay | Admitting: Family Medicine

## 2021-02-19 ENCOUNTER — Other Ambulatory Visit: Payer: Self-pay

## 2021-02-19 DIAGNOSIS — I10 Essential (primary) hypertension: Secondary | ICD-10-CM

## 2021-02-19 MED ORDER — TRIAMTERENE-HCTZ 37.5-25 MG PO TABS: 1.0000 | ORAL_TABLET | Freq: Every day | ORAL | 0 refills | Status: DC

## 2021-02-19 NOTE — Telephone Encounter (Signed)
I spoke with the pt and let him know why the refill was filled only with a few pills. He has an appt with [place] on the 24th so another refill was sent in to his pharmacy. He is aware that if he doesn't come in, it will no longer be filled. Pt understood and will be here for his appt.

## 2021-02-19 NOTE — Telephone Encounter (Signed)
Pt is calling and per pt [facility] only gave him #30 of maxzide 37.5-25 mg instead of 48. Pt is calling to see if he can get someone to call to see why they only gave him #30 . Pt will need refill. [facility] [address] in [place]

## 2021-02-24 ENCOUNTER — Other Ambulatory Visit: Payer: Self-pay | Admitting: Family Medicine

## 2021-02-24 ENCOUNTER — Telehealth: Payer: Self-pay | Admitting: Family Medicine

## 2021-02-24 DIAGNOSIS — I10 Essential (primary) hypertension: Secondary | ICD-10-CM

## 2021-02-24 NOTE — Telephone Encounter (Signed)
Medication Refill - Medication: amlodipine 10  mg  Has the patient contacted their pharmacy? Yes, was told dr didn't fill, but nothing was noted for previous request ( Preferred Pharmacy (with phone number or street name): [place], [place]  Agent: Please be advised that RX refills may take up to 3 business days. We ask that you follow-up with your pharmacy.

## 2021-02-24 NOTE — Telephone Encounter (Signed)
  Notes to clinic:  review for refill Per [facility] the 48 tablets were picked up on 01/25/2021 Patient should have enough until appt    Requested Prescriptions  Pending Prescriptions Disp Refills   amLODipine (NORVASC) 10 MG tablet 48 tablet 0    Sig: Take 1 tablet (10 mg total) by mouth daily.      Cardiovascular:  Calcium Channel Blockers Failed - 02/24/2021  9:47 AM      Failed - Last BP in normal range    BP Readings from Last 1 Encounters:  12/19/20 (!) 143/79          Passed - Valid encounter within last 6 months    Recent Outpatient Visits           2 months ago Recurrent sinusitis   [place], DO   5 months ago Acute non-recurrent frontal sinusitis   [place], DO   8 months ago Chronic frontal sinusitis   [facility] Olin Hauser, [place]   11 months ago Left inguinal hernia   [place], Devonne Doughty, DO   1 year ago Essential hypertension   [place], DO       Future Appointments             In 2 weeks [place], Devonne Doughty, [facility], [facility]

## 2021-03-03 ENCOUNTER — Telehealth: Payer: Self-pay

## 2021-03-03 NOTE — Telephone Encounter (Signed)
Trish in ENDO called to let the patient know what time he needed to be at the ENDO unit tomorrow for his colonoscopy. He told Wannetta Sender he is In [place] and will not be back for 4 weeks. He states he will have to reschedule. Called patient and left a message for call back to reschedule his procedure.

## 2021-03-04 ENCOUNTER — Encounter: Admission: RE | Payer: Self-pay | Source: Home / Self Care

## 2021-03-04 ENCOUNTER — Ambulatory Visit
Admission: RE | Admit: 2021-03-04 | Payer: BC Managed Care – PPO | Source: Home / Self Care | Admitting: Gastroenterology

## 2021-03-04 SURGERY — COLONOSCOPY WITH PROPOFOL
Anesthesia: General

## 2021-03-06 ENCOUNTER — Telehealth: Payer: Self-pay | Admitting: Family Medicine

## 2021-03-06 NOTE — Telephone Encounter (Signed)
Pt called in for assistance. Pt says that provider provided him with 48 tablets of triamterene-hydrochlorothiazide (MAXZIDE-25) 37.5-25 MG tablet   pt says the pharmacy only gave him half of his Rx. Pt says that he went out of town and lost his medication that was provided. Pt would like to know if there is something  that provider could do to help?    [phone] - please assist

## 2021-03-09 ENCOUNTER — Other Ambulatory Visit: Payer: Self-pay | Admitting: Family Medicine

## 2021-03-09 ENCOUNTER — Telehealth: Payer: Self-pay

## 2021-03-09 ENCOUNTER — Other Ambulatory Visit: Payer: Self-pay

## 2021-03-09 ENCOUNTER — Other Ambulatory Visit: Payer: BC Managed Care – PPO

## 2021-03-09 DIAGNOSIS — Z0000 Encounter for general adult medical examination without abnormal findings: Secondary | ICD-10-CM

## 2021-03-09 DIAGNOSIS — R7309 Other abnormal glucose: Secondary | ICD-10-CM

## 2021-03-09 DIAGNOSIS — Z1159 Encounter for screening for other viral diseases: Secondary | ICD-10-CM

## 2021-03-09 DIAGNOSIS — E7800 Pure hypercholesterolemia, unspecified: Secondary | ICD-10-CM | POA: Diagnosis not present

## 2021-03-09 DIAGNOSIS — Z114 Encounter for screening for human immunodeficiency virus [HIV]: Secondary | ICD-10-CM

## 2021-03-09 DIAGNOSIS — Z125 Encounter for screening for malignant neoplasm of prostate: Secondary | ICD-10-CM | POA: Diagnosis not present

## 2021-03-09 DIAGNOSIS — I10 Essential (primary) hypertension: Secondary | ICD-10-CM | POA: Diagnosis not present

## 2021-03-09 DIAGNOSIS — F1420 Cocaine dependence, uncomplicated: Secondary | ICD-10-CM | POA: Diagnosis not present

## 2021-03-09 NOTE — Telephone Encounter (Signed)
Pt is requesting a refill on amlodipine 10 [address]

## 2021-03-09 NOTE — Telephone Encounter (Signed)
Patient was already notified that it will be refilled at the time of his appt on 6/24. It will not be fill any sooner.

## 2021-03-10 NOTE — Telephone Encounter (Signed)
Very unusual. I see it looks like back earlier this month when his medication was ordered it was intended for 48 pills I suppose this was the exact # of pills to get him to apt. I don't usually do that, must have been done by [facility].  This medication is not able to be abused therefore it is not a problem.  The only issue is that the insurance may not pay for it. It is not really up to [place] - it is up to him and the pharmacy  Can you check with pharmacy see what they need from [place]?  Nobie Putnam, [facility] 03/10/2021, 8:22 AM

## 2021-03-10 NOTE — Telephone Encounter (Signed)
I tried calling the pharmacy but they do not open until 9... I will call back in just a bit

## 2021-03-12 ENCOUNTER — Other Ambulatory Visit: Payer: Self-pay | Admitting: Family Medicine

## 2021-03-12 DIAGNOSIS — I10 Essential (primary) hypertension: Secondary | ICD-10-CM

## 2021-03-12 NOTE — Telephone Encounter (Signed)
   Notes to clinic This pt was given 48 tabs to last until his appt 6/24 which he has canceled and rescheduled for 7/26 therefore he will not have enough until his new appt date. Please assess.

## 2021-03-12 NOTE — Telephone Encounter (Signed)
Any further update on this one?

## 2021-03-13 ENCOUNTER — Other Ambulatory Visit: Payer: Self-pay

## 2021-03-13 ENCOUNTER — Encounter: Payer: BC Managed Care – PPO | Admitting: Family Medicine

## 2021-03-13 DIAGNOSIS — I10 Essential (primary) hypertension: Secondary | ICD-10-CM

## 2021-03-13 LAB — HEPATITIS C ANTIBODY
Hepatitis C Ab: REACTIVE — AB
SIGNAL TO CUT-OFF: 23.9 — ABNORMAL HIGH (ref <1.00)

## 2021-03-13 LAB — CBC WITH DIFFERENTIAL/PLATELET
Absolute Monocytes: 446 cells/uL (ref 200–950)
Basophils Absolute: 61 cells/uL (ref 0–200)
Basophils Relative: 1.1 %
Eosinophils Absolute: 369 cells/uL (ref 15–500)
Eosinophils Relative: 6.7 %
HCT: 44.3 % (ref 38.5–50.0)
Hemoglobin: 15 g/dL (ref 13.2–17.1)
Lymphs Abs: 1293 cells/uL (ref 850–3900)
MCH: 31.2 pg (ref 27.0–33.0)
MCHC: 33.9 g/dL (ref 32.0–36.0)
MCV: 92.1 fL (ref 80.0–100.0)
MPV: 8.6 fL (ref 7.5–12.5)
Monocytes Relative: 8.1 %
Neutro Abs: 3333 cells/uL (ref 1500–7800)
Neutrophils Relative %: 60.6 %
Platelets: 282 10*3/uL (ref 140–400)
RBC: 4.81 10*6/uL (ref 4.20–5.80)
RDW: 12.8 % (ref 11.0–15.0)
Total Lymphocyte: 23.5 %
WBC: 5.5 10*3/uL (ref 3.8–10.8)

## 2021-03-13 LAB — LIPID PANEL
Cholesterol: 159 mg/dL (ref <200)
HDL: 74 mg/dL (ref >=40)
LDL Cholesterol (Calc): 69 mg/dL (calc)
Non-HDL Cholesterol (Calc): 85 mg/dL (calc) (ref <130)
Total CHOL/HDL Ratio: 2.1 (calc) (ref <5.0)
Triglycerides: 84 mg/dL (ref <150)

## 2021-03-13 LAB — COMPLETE METABOLIC PANEL WITH GFR
AG Ratio: 1.7 (calc) (ref 1.0–2.5)
ALT: 17 U/L (ref 9–46)
AST: 16 U/L (ref 10–35)
Albumin: 4.5 g/dL (ref 3.6–5.1)
Alkaline phosphatase (APISO): 47 U/L (ref 35–144)
BUN: 17 mg/dL (ref 7–25)
CO2: 30 mmol/L (ref 20–32)
Calcium: 9.6 mg/dL (ref 8.6–10.3)
Chloride: 97 mmol/L — ABNORMAL LOW (ref 98–110)
Creat: 1.05 mg/dL (ref 0.70–1.33)
GFR, Est African American: 95 mL/min/{1.73_m2} (ref >=60)
GFR, Est Non African American: 82 mL/min/{1.73_m2} (ref >=60)
Globulin: 2.6 g/dL (calc) (ref 1.9–3.7)
Glucose, Bld: 87 mg/dL (ref 65–99)
Potassium: 4.3 mmol/L (ref 3.5–5.3)
Sodium: 135 mmol/L (ref 135–146)
Total Bilirubin: 0.4 mg/dL (ref 0.2–1.2)
Total Protein: 7.1 g/dL (ref 6.1–8.1)

## 2021-03-13 LAB — HIV ANTIBODY (ROUTINE TESTING W REFLEX): HIV 1&2 Ab, 4th Generation: NONREACTIVE

## 2021-03-13 LAB — HEMOGLOBIN A1C
Hgb A1c MFr Bld: 5.2 % of total Hgb (ref <5.7)
Mean Plasma Glucose: 103 mg/dL
eAG (mmol/L): 5.7 mmol/L

## 2021-03-13 LAB — HCV RNA,QUANTITATIVE REAL TIME PCR
HCV Quantitative Log: <1.18 Log IU/mL
HCV RNA, PCR, QN: <15 IU/mL

## 2021-03-13 LAB — PSA: PSA: 1.02 ng/mL (ref <=4.00)

## 2021-03-13 MED ORDER — AMLODIPINE BESYLATE 10 MG PO TABS: 1.0000 | ORAL_TABLET | Freq: Every day | ORAL | 0 refills | Status: DC

## 2021-03-13 NOTE — Telephone Encounter (Addendum)
Pt had rsc his cpe until 04-14-2021

## 2021-04-14 ENCOUNTER — Other Ambulatory Visit: Payer: Self-pay

## 2021-04-14 ENCOUNTER — Encounter: Payer: Self-pay | Admitting: Family Medicine

## 2021-04-14 ENCOUNTER — Ambulatory Visit (INDEPENDENT_AMBULATORY_CARE_PROVIDER_SITE_OTHER): Payer: BC Managed Care – PPO | Admitting: Family Medicine

## 2021-04-14 ENCOUNTER — Other Ambulatory Visit: Payer: Self-pay | Admitting: Family Medicine

## 2021-04-14 ENCOUNTER — Encounter: Payer: BC Managed Care – PPO | Admitting: Family Medicine

## 2021-04-14 VITALS — BP 134/75 | HR 68 | Ht 73.0 in | Wt 218.8 lb

## 2021-04-14 DIAGNOSIS — I10 Essential (primary) hypertension: Secondary | ICD-10-CM

## 2021-04-14 DIAGNOSIS — N529 Male erectile dysfunction, unspecified: Secondary | ICD-10-CM | POA: Diagnosis not present

## 2021-04-14 DIAGNOSIS — Z0000 Encounter for general adult medical examination without abnormal findings: Secondary | ICD-10-CM

## 2021-04-14 DIAGNOSIS — R7309 Other abnormal glucose: Secondary | ICD-10-CM

## 2021-04-14 DIAGNOSIS — E7800 Pure hypercholesterolemia, unspecified: Secondary | ICD-10-CM | POA: Diagnosis not present

## 2021-04-14 DIAGNOSIS — Z125 Encounter for screening for malignant neoplasm of prostate: Secondary | ICD-10-CM

## 2021-04-14 DIAGNOSIS — Z1211 Encounter for screening for malignant neoplasm of colon: Secondary | ICD-10-CM

## 2021-04-14 MED ORDER — ATORVASTATIN CALCIUM 10 MG PO TABS: 10.0000 mg | ORAL_TABLET | Freq: Every day | ORAL | 3 refills | Status: AC

## 2021-04-14 MED ORDER — TADALAFIL 20 MG PO TABS: 20.0000 mg | ORAL_TABLET | ORAL | 3 refills | Status: DC | PRN

## 2021-04-14 MED ORDER — SILDENAFIL CITRATE 20 MG PO TABS: 20.0000 mg | ORAL_TABLET | Freq: Every day | ORAL | 3 refills | Status: DC | PRN

## 2021-04-14 MED ORDER — TRIAMTERENE-HCTZ 37.5-25 MG PO TABS: 1.0000 | ORAL_TABLET | Freq: Every day | ORAL | 3 refills | Status: DC

## 2021-04-14 MED ORDER — CLENPIQ 10-3.5-12 MG-GM -GM/160ML PO SOLN: 320.0000 mL | ORAL | 0 refills | Status: AC

## 2021-04-14 MED ORDER — AMLODIPINE BESYLATE 10 MG PO TABS: 10.0000 mg | ORAL_TABLET | Freq: Every day | ORAL | 3 refills | Status: DC

## 2021-04-14 NOTE — Progress Notes (Signed)
Subjective:    Patient ID: Nathan Wade, male    DOB: [birth year], 50 y.o.   MRN: [id number]  Nathan Wade is a 50 y.o. male presenting on 04/14/2021 for Annual Exam   HPI  Here for Annual Physical and Lab Review.  CHRONIC HTN: Last labs improved, resolved hyponatremia On meds, BP controlled. Needs refills Reports good compliance, took some meds today Lifestyle: - Diet: balanced, drinks a lot of water - Exercise: improved exercise now cardiovascular and weight training - Drinks coffee frequently, goal to reduce Denies CP, dyspnea, HA, edema, dizziness / lightheadedness   Erectile Dysfunction: Chronic problem >1 year Improved on Tadalafil PRN, occasionally Sildenafil works better, he takes one or the other, never takes both, follows instructions, he prefers to purchase both meds to have options to use.    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 No prior screening. Currently asymptomatic. No known family history of colon CA. Father had polyps age 39+ request referral today, previous cologuard never completed  COVID19 vaccine 12/2019 x 2, moderna. Has not had booster, considering this.  Shingrix vaccine in future when ready.   Depression screen [facility] 2/9 04/14/2021 02/22/2020 02/01/2020  Decreased Interest 0 0 0  Down, Depressed, Hopeless 0 0 0  PHQ - 2 Score 0 0 0  Altered sleeping 0 - -  Tired, decreased energy 0 - -  Change in appetite 0 - -  Feeling bad or failure about yourself  0 - -  Trouble concentrating 0 - -  Moving slowly or fidgety/restless 0 - -  Suicidal thoughts 0 - -  PHQ-9 Score 0 - -  Difficult doing work/chores Not difficult at all - -    Past Medical History:  Diagnosis Date   Hypertension    Past Surgical History:  Procedure Laterality Date   BACK SURGERY  1990   HERNIA REPAIR     INGUINAL HERNIA REPAIR Right 2003   open repair, with mesh   XI ROBOTIC ASSISTED INGUINAL HERNIA REPAIR WITH  MESH Left 07/09/2020   Procedure: XI ROBOTIC ASSISTED INGUINAL HERNIA REPAIR WITH MESH;  Surgeon: Fredirick Maudlin, MD;  Location: [facility] ORS;  Service: General;  Laterality: Left;   Social History   Socioeconomic History   Marital status: Married    Spouse name: Not on file   Number of children: Not on file   Years of education: High School   Highest education level: High school graduate  Occupational History   Occupation: Glass blower/designer  Tobacco Use   Smoking status: Former    Packs/day: 0.50    Years: 4.00    Pack years: 2.00    Types: Cigarettes    Quit date: 2012    Years since quitting: 10.5   Smokeless tobacco: Never  Vaping Use   Vaping Use: Never used  Substance and Sexual Activity   Alcohol use: Yes   Drug use: Not Currently   Sexual activity: Not on file  Other Topics Concern   Not on file  Social History Narrative   Not on file   Social Determinants of Health   Financial Resource Strain: Not on file  Food Insecurity: Not on file  Transportation Needs: Not on file  Physical Activity: Not on file  Stress: Not on file  Social Connections: Not on file  Intimate Partner Violence: Not on file   Family History  Problem Relation Age of Onset   Heart  attack Mother    Current Outpatient Medications on File Prior to Visit  Medication Sig   CREATINE PO Take 1 Scoop by mouth daily.   ibuprofen (ADVIL) 800 MG tablet Take 1 tablet (800 mg total) by mouth every 8 (eight) hours as needed.   ipratropium (ATROVENT) 0.06 % nasal spray Place 2 sprays into both nostrils 4 (four) times daily.   Multiple Vitamin (MULTIVITAMIN WITH MINERALS) TABS tablet Take 1 tablet by mouth daily.   No current facility-administered medications on file prior to visit.    Review of Systems  Constitutional:  Negative for activity change, appetite change, chills, diaphoresis, fatigue and fever.  HENT:  Negative for congestion and hearing loss.   Eyes:  Negative for visual disturbance.   Respiratory:  Negative for cough, chest tightness, shortness of breath and wheezing.   Cardiovascular:  Negative for chest pain, palpitations and leg swelling.  Gastrointestinal:  Negative for abdominal pain, constipation, diarrhea, nausea and vomiting.  Genitourinary:  Negative for dysuria, frequency and hematuria.  Musculoskeletal:  Negative for arthralgias and neck pain.  Skin:  Negative for rash.  Allergic/Immunologic: Negative for environmental allergies.  Neurological:  Negative for dizziness, weakness, light-headedness, numbness and headaches.  Hematological:  Negative for adenopathy.  Psychiatric/Behavioral:  Negative for behavioral problems, dysphoric mood and sleep disturbance.   Per HPI unless specifically indicated above     Objective:    BP 134/75   Pulse 68   Ht '6\' 1"'$  (1.854 m)   Wt 218 lb 12.8 oz (99.2 kg)   SpO2 99%   BMI 28.87 kg/m   Wt Readings from Last 3 Encounters:  04/14/21 218 lb 12.8 oz (99.2 kg)  12/19/20 210 lb 6.4 oz (95.4 kg)  09/09/20 215 lb 3.2 oz (97.6 kg)    Physical Exam Vitals and nursing note reviewed.  Constitutional:      General: He is not in acute distress.    Appearance: He is well-developed. He is not diaphoretic.     Comments: Well-appearing, comfortable, cooperative  HENT:     Head: Normocephalic and atraumatic.  Eyes:     General:        Right eye: No discharge.        Left eye: No discharge.     Conjunctiva/sclera: Conjunctivae normal.     Pupils: Pupils are equal, round, and reactive to light.  Neck:     Thyroid: No thyromegaly.     Vascular: No carotid bruit.  Cardiovascular:     Rate and Rhythm: Normal rate and regular rhythm.     Pulses: Normal pulses.     Heart sounds: Normal heart sounds. No murmur heard. Pulmonary:     Effort: Pulmonary effort is normal. No respiratory distress.     Breath sounds: Normal breath sounds. No wheezing or rales.  Abdominal:     General: Bowel sounds are normal. There is no  distension.     Palpations: Abdomen is soft. There is no mass.     Tenderness: There is no abdominal tenderness.  Musculoskeletal:        General: No tenderness. Normal range of motion.     Cervical back: Normal range of motion and neck supple.     Comments: Upper / Lower Extremities: - Normal muscle tone, strength bilateral upper extremities 5/5, lower extremities 5/5  Lymphadenopathy:     Cervical: No cervical adenopathy.  Skin:    General: Skin is warm and dry.     Findings: Lesion (R lower mid  abdomen with palpable superficial mobile dense nodule oblong shape 2 x 1 cm approx. non tender - likely lipoma) present. No erythema or rash.  Neurological:     Mental Status: He is alert and oriented to person, place, and time.     Comments: Distal sensation intact to light touch all extremities  Psychiatric:        Mood and Affect: Mood normal.        Behavior: Behavior normal.        Thought Content: Thought content normal.     Comments: Well groomed, good eye contact, normal speech and thoughts     Results for orders placed or performed in visit on 03/09/21  Lipid panel  Result Value Ref Range   Cholesterol 159 <200 mg/dL   HDL 74 > OR = 40 mg/dL   Triglycerides 84 <150 mg/dL   LDL Cholesterol (Calc) 69 mg/dL (calc)   Total CHOL/HDL Ratio 2.1 <5.0 (calc)   Non-HDL Cholesterol (Calc) 85 <130 mg/dL (calc)  HIV Antibody (routine testing w rflx)  Result Value Ref Range   HIV 1&2 Ab, 4th Generation NON-REACTIVE NON-REACTIVE  Hepatitis C antibody  Result Value Ref Range   Hepatitis C Ab REACTIVE (A) NON-REACTIVE   SIGNAL TO CUT-OFF 23.90 (H) <1.00  PSA  Result Value Ref Range   PSA 1.02 < OR = 4.00 ng/mL  COMPLETE METABOLIC PANEL WITH GFR  Result Value Ref Range   Glucose, Bld 87 65 - 99 mg/dL   BUN 17 7 - 25 mg/dL   Creat 1.05 0.70 - 1.33 mg/dL   GFR, Est Non African American 82 > OR = 60 mL/min/1.70m   GFR, Est African American 95 > OR = 60 mL/min/1.757m  BUN/Creatinine  Ratio NOT APPLICABLE 6 - 22 (calc)   Sodium 135 135 - 146 mmol/L   Potassium 4.3 3.5 - 5.3 mmol/L   Chloride 97 (L) 98 - 110 mmol/L   CO2 30 20 - 32 mmol/L   Calcium 9.6 8.6 - 10.3 mg/dL   Total Protein 7.1 6.1 - 8.1 g/dL   Albumin 4.5 3.6 - 5.1 g/dL   Globulin 2.6 1.9 - 3.7 g/dL (calc)   AG Ratio 1.7 1.0 - 2.5 (calc)   Total Bilirubin 0.4 0.2 - 1.2 mg/dL   Alkaline phosphatase (APISO) 47 35 - 144 U/L   AST 16 10 - 35 U/L   ALT 17 9 - 46 U/L  CBC with Differential/Platelet  Result Value Ref Range   WBC 5.5 3.8 - 10.8 Thousand/uL   RBC 4.81 4.20 - 5.80 Million/uL   Hemoglobin 15.0 13.2 - 17.1 g/dL   HCT 44.3 38.5 - 50.0 %   MCV 92.1 80.0 - 100.0 fL   MCH 31.2 27.0 - 33.0 pg   MCHC 33.9 32.0 - 36.0 g/dL   RDW 12.8 11.0 - 15.0 %   Platelets 282 140 - 400 Thousand/uL   MPV 8.6 7.5 - 12.5 fL   Neutro Abs 3,333 1,500 - 7,800 cells/uL   Lymphs Abs 1,293 850 - 3,900 cells/uL   Absolute Monocytes 446 200 - 950 cells/uL   Eosinophils Absolute 369 15 - 500 cells/uL   Basophils Absolute 61 0 - 200 cells/uL   Neutrophils Relative % 60.6 %   Total Lymphocyte 23.5 %   Monocytes Relative 8.1 %   Eosinophils Relative 6.7 %   Basophils Relative 1.1 %  Hemoglobin A1c  Result Value Ref Range   Hgb A1c MFr Bld 5.2 <5.7 %  of total Hgb   Mean Plasma Glucose 103 mg/dL   eAG (mmol/L) 5.7 mmol/L  HCV RNA, Quantitative Real Time PCR  Result Value Ref Range   HCV RNA, PCR, QN <15 NOT DETECTED NOT DETECTED IU/mL   HCV Quantitative Log <1.18 NOT DETECTED NOT DETECTED Log IU/mL      Assessment & Plan:   Problem List Items Addressed This Visit     Essential hypertension    Improved, controlled HTN No known complications - but history of ED related to BP    Plan:  1. Continue Maxzide 37.5-'25mg'$  daily (Triamterene, HCTZ) daily, and Amlodipine '10mg'$  daily - ordered 90 day. 2. Encourage improved lifestyle - low sodium diet, regular exercise, reduce stress, limit caffeine coffee  monitor BP  outside office, bring readings to next visit, if persistently >140/90 or new symptoms notify office sooner  Normalized Hyponatremia       Relevant Medications   amLODipine (NORVASC) 10 MG tablet   atorvastatin (LIPITOR) 10 MG tablet   triamterene-hydrochlorothiazide (MAXZIDE-25) 37.5-25 MG tablet   tadalafil (CIALIS) 20 MG tablet   sildenafil (REVATIO) 20 MG tablet   Erectile dysfunction    Improved ED on medication PDE5 He uses occasional Sildenafil vs Tadalafil PRN, requests both meds, he is fully aware to NOT take both at same time or within time interval.       Relevant Medications   tadalafil (CIALIS) 20 MG tablet   sildenafil (REVATIO) 20 MG tablet   Other Visit Diagnoses     Annual physical exam    -  Primary   Pure hypercholesterolemia       Relevant Medications   amLODipine (NORVASC) 10 MG tablet   atorvastatin (LIPITOR) 10 MG tablet   triamterene-hydrochlorothiazide (MAXZIDE-25) 37.5-25 MG tablet   tadalafil (CIALIS) 20 MG tablet   sildenafil (REVATIO) 20 MG tablet   Screening for colon cancer       Relevant Orders   Ambulatory referral to Gastroenterology       Updated Health Maintenance information PSA negative.  Referral to [facility] Colonoscopy initial screening colon CA  Reviewed recent lab results with patient Encouraged improvement to lifestyle with diet and exercise  Goal of weight loss  Orders Placed This Encounter  Procedures   Ambulatory referral to Gastroenterology    Referral Priority:   Routine    Referral Type:   Consultation    Referral Reason:   Specialty Services Required    Number of Visits Requested:   1      Meds ordered this encounter  Medications   amLODipine (NORVASC) 10 MG tablet    Sig: Take 1 tablet (10 mg total) by mouth daily.    Dispense:  90 tablet    Refill:  3   atorvastatin (LIPITOR) 10 MG tablet    Sig: Take 1 tablet (10 mg total) by mouth at bedtime.    Dispense:  90 tablet    Refill:  3    triamterene-hydrochlorothiazide (MAXZIDE-25) 37.5-25 MG tablet    Sig: Take 1 tablet by mouth daily.    Dispense:  90 tablet    Refill:  3   tadalafil (CIALIS) 20 MG tablet    Sig: Take 1 tablet (20 mg total) by mouth every other day as needed for erectile dysfunction. Do not take with sildenafil.    Dispense:  30 tablet    Refill:  3   sildenafil (REVATIO) 20 MG tablet    Sig: Take 1-5 tablets (20-100 mg total) by mouth  daily as needed (30 mins prior to intercourse). Do not take with Tadalafil    Dispense:  100 tablet    Refill:  3      Follow up plan: Return in about 1 year (around 04/14/2022) for 1 year fasting lab only then 1 week later Annual Physical.  Future orders in.  Nobie Putnam, DO [facility] 04/14/2021, 1:34 PM

## 2021-04-14 NOTE — Assessment & Plan Note (Signed)
Improved ED on medication PDE5 He uses occasional Sildenafil vs Tadalafil PRN, requests both meds, he is fully aware to NOT take both at same time or within time interval.

## 2021-04-14 NOTE — Patient Instructions (Addendum)
Thank you for coming to the office today.  Still believe the spot on your abdomen is a Lipoma, recommend general surgery consultation can do excisional biopsy.  90 day with 3 refills on all rx - check with insurance plan / pharmacy about a Mail Order 90 day service, maybe this is easier.  Referral for Colonoscopy  [facility]) [address], [place] [address] Phone: [phone]  -------------------------------------------  DUE for FASTING BLOOD WORK (no food or drink after midnight before the lab appointment, only water or coffee without cream/sugar on the morning of)  SCHEDULE "Lab Only" visit in the morning at the clinic for lab draw in 1 YEAR  - Make sure Lab Only appointment is at about 1 week before your next appointment, so that results will be available  For Lab Results, once available within 2-3 days of blood draw, you can can log in to MyChart online to view your results and a brief explanation. Also, we can discuss results at next follow-up visit.   Please schedule a Follow-up Appointment to: Return in about 1 year (around 04/14/2022) for 1 year fasting lab only then 1 week later Annual Physical.  If you have any other questions or concerns, please feel free to call the office or send a message through [facility]. You may also schedule an earlier appointment if necessary.  Additionally, you may be receiving a survey about your experience at our office within a few days to 1 week by e-mail or mail. We value your feedback.  Nobie Putnam, DO [place]

## 2021-04-14 NOTE — Assessment & Plan Note (Signed)
Improved, controlled HTN No known complications - but history of ED related to BP    Plan:  1. Continue Maxzide 37.5-'25mg'$  daily (Triamterene, HCTZ) daily, and Amlodipine '10mg'$  daily - ordered 90 day. 2. Encourage improved lifestyle - low sodium diet, regular exercise, reduce stress, limit caffeine coffee  monitor BP outside office, bring readings to next visit, if persistently >140/90 or new symptoms notify office sooner  Normalized Hyponatremia

## 2021-05-05 DIAGNOSIS — M5416 Radiculopathy, lumbar region: Secondary | ICD-10-CM | POA: Diagnosis not present

## 2021-05-05 DIAGNOSIS — Z9889 Other specified postprocedural states: Secondary | ICD-10-CM | POA: Diagnosis not present

## 2021-05-05 DIAGNOSIS — M545 Low back pain, unspecified: Secondary | ICD-10-CM | POA: Diagnosis not present

## 2021-05-07 ENCOUNTER — Ambulatory Visit
Admission: RE | Admit: 2021-05-07 | Payer: BC Managed Care – PPO | Source: Home / Self Care | Admitting: Gastroenterology

## 2021-05-07 ENCOUNTER — Encounter: Admission: RE | Payer: Self-pay | Source: Home / Self Care

## 2021-05-07 SURGERY — COLONOSCOPY
Anesthesia: General

## 2021-05-07 NOTE — Op Note (Addendum)
[facility] Patient Name: Nathan Wade Procedure Date: 05/07/2021 7:18 AM MRN: [id number] Account #: [id number] Date of Birth: [birth year] Admit Type: Outpatient Age: 50 Room: [facility] ENDO ROOM 3 Gender: Male Note Status: Finalized THIS EXAM WAS SENT IN ERROR

## 2021-05-18 DIAGNOSIS — M48061 Spinal stenosis, lumbar region without neurogenic claudication: Secondary | ICD-10-CM | POA: Diagnosis not present

## 2021-05-18 DIAGNOSIS — M5416 Radiculopathy, lumbar region: Secondary | ICD-10-CM | POA: Diagnosis not present

## 2021-05-18 DIAGNOSIS — M545 Low back pain, unspecified: Secondary | ICD-10-CM | POA: Diagnosis not present

## 2021-05-21 DIAGNOSIS — M48061 Spinal stenosis, lumbar region without neurogenic claudication: Secondary | ICD-10-CM | POA: Diagnosis not present

## 2021-05-21 DIAGNOSIS — M5416 Radiculopathy, lumbar region: Secondary | ICD-10-CM | POA: Diagnosis not present

## 2021-05-21 DIAGNOSIS — M545 Low back pain, unspecified: Secondary | ICD-10-CM | POA: Diagnosis not present

## 2021-05-21 DIAGNOSIS — Z9889 Other specified postprocedural states: Secondary | ICD-10-CM | POA: Diagnosis not present

## 2021-06-01 DIAGNOSIS — F1420 Cocaine dependence, uncomplicated: Secondary | ICD-10-CM | POA: Diagnosis not present

## 2021-06-03 DIAGNOSIS — F1420 Cocaine dependence, uncomplicated: Secondary | ICD-10-CM | POA: Diagnosis not present

## 2021-06-08 DIAGNOSIS — F1420 Cocaine dependence, uncomplicated: Secondary | ICD-10-CM | POA: Diagnosis not present

## 2021-06-10 DIAGNOSIS — F1420 Cocaine dependence, uncomplicated: Secondary | ICD-10-CM | POA: Diagnosis not present

## 2021-06-14 ENCOUNTER — Telehealth: Payer: BC Managed Care – PPO | Admitting: Family

## 2021-06-14 DIAGNOSIS — M7031 Other bursitis of elbow, right elbow: Secondary | ICD-10-CM

## 2021-06-14 DIAGNOSIS — Z5329 Procedure and treatment not carried out because of patient's decision for other reasons: Secondary | ICD-10-CM

## 2021-06-14 DIAGNOSIS — Z91199 Patient's noncompliance with other medical treatment and regimen due to unspecified reason: Secondary | ICD-10-CM

## 2021-06-14 NOTE — Progress Notes (Signed)
Virtual Visit Consent   [name], you are scheduled for a virtual visit with a [facility] provider today.     Just as with appointments in the office, your consent must be obtained to participate.  Your consent will be active for this visit and any virtual visit you may have with one of our providers in the next 365 days.     If you have a MyChart account, a copy of this consent can be sent to you electronically.  All virtual visits are billed to your insurance company just like a traditional visit in the office.    As this is a virtual visit, video technology does not allow for your provider to perform a traditional examination.  This may limit your provider's ability to fully assess your condition.  If your provider identifies any concerns that need to be evaluated in person or the need to arrange testing (such as labs, EKG, etc.), we will make arrangements to do so.     Although advances in technology are sophisticated, we cannot ensure that it will always work on either your end or our end.  If the connection with a video visit is poor, the visit may have to be switched to a telephone visit.  With either a video or telephone visit, we are not always able to ensure that we have a secure connection.     I need to obtain your verbal consent now.   Are you willing to proceed with your visit today?    [name] has provided verbal consent on 06/14/2021 for a virtual visit (video or telephone).   Nathan Dun, FNP   Date: 06/14/2021 1:42 PM   Virtual Visit via Video Note   I, Nathan Wade, connected with  Nathan Wade  ([id number], 1971/04/04) on 06/14/21 at  1:30 PM EDT by a video-enabled telemedicine application and verified that I am speaking with the correct person using two identifiers.  Location: Patient: Virtual Visit Location Patient: Home Provider: Virtual Visit Location Provider: Home   I discussed the limitations of evaluation and management by telemedicine and the  availability of in person appointments. The patient expressed understanding and agreed to proceed.    History of Present Illness: Nathan Wade is a 50 y.o. who identifies as a male who was assigned male at birth, and is being seen today for right elbow swelling. States he is having mild pain of 5 out 10. Denies any trauma. He has taken motrin with mild relief.  Denies any redness, fever, heat, or discharge.    HPI: HPI  Problems:  Patient Active Problem List   Diagnosis Date Noted   Status post inguinal hernia repair using synthetic patch 07/24/2020   Left inguinal hernia    Essential hypertension 02/01/2020   Erectile dysfunction 02/01/2020    Allergies: No Known Allergies Medications:  Current Outpatient Medications:    amLODipine (NORVASC) 10 MG tablet, Take 1 tablet (10 mg total) by mouth daily., Disp: 90 tablet, Rfl: 3   atorvastatin (LIPITOR) 10 MG tablet, Take 1 tablet (10 mg total) by mouth at bedtime., Disp: 90 tablet, Rfl: 3   CREATINE PO, Take 1 Scoop by mouth daily., Disp: , Rfl:    ibuprofen (ADVIL) 800 MG tablet, Take 1 tablet (800 mg total) by mouth every 8 (eight) hours as needed., Disp: 30 tablet, Rfl: 0   ipratropium (ATROVENT) 0.06 % nasal spray, Place 2 sprays into both nostrils 4 (four) times daily., Disp: 15  mL, Rfl: 3   Multiple Vitamin (MULTIVITAMIN WITH MINERALS) TABS tablet, Take 1 tablet by mouth daily., Disp: , Rfl:    sildenafil (REVATIO) 20 MG tablet, Take 1-5 tablets (20-100 mg total) by mouth daily as needed (30 mins prior to intercourse). Do not take with Tadalafil, Disp: 100 tablet, Rfl: 3   Sod Picosulfate-Mag Ox-Cit Acd (CLENPIQ) 10-3.5-12 MG-GM -GM/160ML SOLN, Take 320 mLs by mouth as directed., Disp: 320 mL, Rfl: 0   tadalafil (CIALIS) 20 MG tablet, Take 1 tablet (20 mg total) by mouth every other day as needed for erectile dysfunction. Do not take with sildenafil., Disp: 30 tablet, Rfl: 3   triamterene-hydrochlorothiazide (MAXZIDE-25) 37.5-25 MG  tablet, Take 1 tablet by mouth daily., Disp: 90 tablet, Rfl: 3  Observations/Objective: Patient is well-developed, well-nourished in no acute distress.  Resting comfortably  at home.  Head is normocephalic, atraumatic.  No labored breathing.  Speech is clear and coherent with logical content.  Patient is alert and oriented at baseline.  Right elbow bursa swollen, no erythemas noted.   Assessment and Plan: 1. Bursitis of right elbow, unspecified bursa Take Motrin 600 mg TID with food Rest Avoid injury or pressure on elbow Report any worsening of symptoms or s/s of infection  Follow Up Instructions: I discussed the assessment and treatment plan with the patient. The patient was provided an opportunity to ask questions and all were answered. The patient agreed with the plan and demonstrated an understanding of the instructions.  A copy of instructions were sent to the patient via MyChart unless otherwise noted below.     The patient was advised to call back or seek an in-person evaluation if the symptoms worsen or if the condition fails to improve as anticipated.  Time:  I spent 9 minutes with the patient via telehealth technology discussing the above problems/concerns.    Nathan Dun, FNP

## 2021-06-14 NOTE — Progress Notes (Signed)
Attempted to connect with patient. His service was poor and could not connect.   [name], FNP

## 2021-06-15 DIAGNOSIS — F1420 Cocaine dependence, uncomplicated: Secondary | ICD-10-CM | POA: Diagnosis not present

## 2021-06-17 DIAGNOSIS — F1420 Cocaine dependence, uncomplicated: Secondary | ICD-10-CM | POA: Diagnosis not present

## 2021-06-23 ENCOUNTER — Encounter: Payer: Self-pay | Admitting: General Surgery

## 2021-06-23 DIAGNOSIS — F1420 Cocaine dependence, uncomplicated: Secondary | ICD-10-CM | POA: Diagnosis not present

## 2021-06-29 DIAGNOSIS — M5416 Radiculopathy, lumbar region: Secondary | ICD-10-CM | POA: Diagnosis not present

## 2021-06-30 DIAGNOSIS — F1420 Cocaine dependence, uncomplicated: Secondary | ICD-10-CM | POA: Diagnosis not present

## 2021-07-16 DIAGNOSIS — M48061 Spinal stenosis, lumbar region without neurogenic claudication: Secondary | ICD-10-CM | POA: Diagnosis not present

## 2021-07-16 DIAGNOSIS — M79671 Pain in right foot: Secondary | ICD-10-CM | POA: Diagnosis not present

## 2021-07-16 DIAGNOSIS — M5416 Radiculopathy, lumbar region: Secondary | ICD-10-CM | POA: Diagnosis not present

## 2021-07-16 DIAGNOSIS — Z9889 Other specified postprocedural states: Secondary | ICD-10-CM | POA: Diagnosis not present

## 2021-07-17 ENCOUNTER — Other Ambulatory Visit: Payer: Self-pay | Admitting: Family Medicine

## 2021-07-17 DIAGNOSIS — N529 Male erectile dysfunction, unspecified: Secondary | ICD-10-CM

## 2021-07-17 NOTE — Telephone Encounter (Signed)
Medication Refill - Medication: sildenafil (REVATIO) 20 MG tablet tadalafil (CIALIS) 20 MG tablet Has the patient contacted their pharmacy? No. He no longer had the bottle. Preferred Pharmacy (with phone number or street name): [place], [place]  Has the patient been seen for an appointment in the last year OR does the patient have an upcoming appointment? Yes.    Agent: Please be advised that RX refills may take up to 3 business days. We ask that you follow-up with your pharmacy.

## 2021-07-18 MED ORDER — SILDENAFIL CITRATE 20 MG PO TABS: 20.0000 mg | ORAL_TABLET | Freq: Every day | ORAL | 3 refills | Status: AC | PRN

## 2021-07-18 MED ORDER — TADALAFIL 20 MG PO TABS
20.0000 mg | ORAL_TABLET | ORAL | 3 refills | Status: AC | PRN
Start: 2021-07-18 — End: ?

## 2021-07-28 DIAGNOSIS — S99921A Unspecified injury of right foot, initial encounter: Secondary | ICD-10-CM | POA: Diagnosis not present

## 2021-07-28 DIAGNOSIS — S86311A Strain of muscle(s) and tendon(s) of peroneal muscle group at lower leg level, right leg, initial encounter: Secondary | ICD-10-CM | POA: Diagnosis not present

## 2021-08-10 DIAGNOSIS — R2689 Other abnormalities of gait and mobility: Secondary | ICD-10-CM | POA: Diagnosis not present

## 2021-08-10 DIAGNOSIS — M5416 Radiculopathy, lumbar region: Secondary | ICD-10-CM | POA: Diagnosis not present

## 2021-08-18 IMAGING — DX DG CHEST 2V
4 series · 4 of 4 positions shown · non-contrast
Comparison: 04/05/2006

CLINICAL DATA: Former smoker for 2 years, h/o exposure to second
hand smoke, sometimes has tickle in his throat and cough with that,
htn, no other lung/heart history or surgery per pt

EXAM:
CHEST - 2 VIEW

[chest pa (1 of 2)]
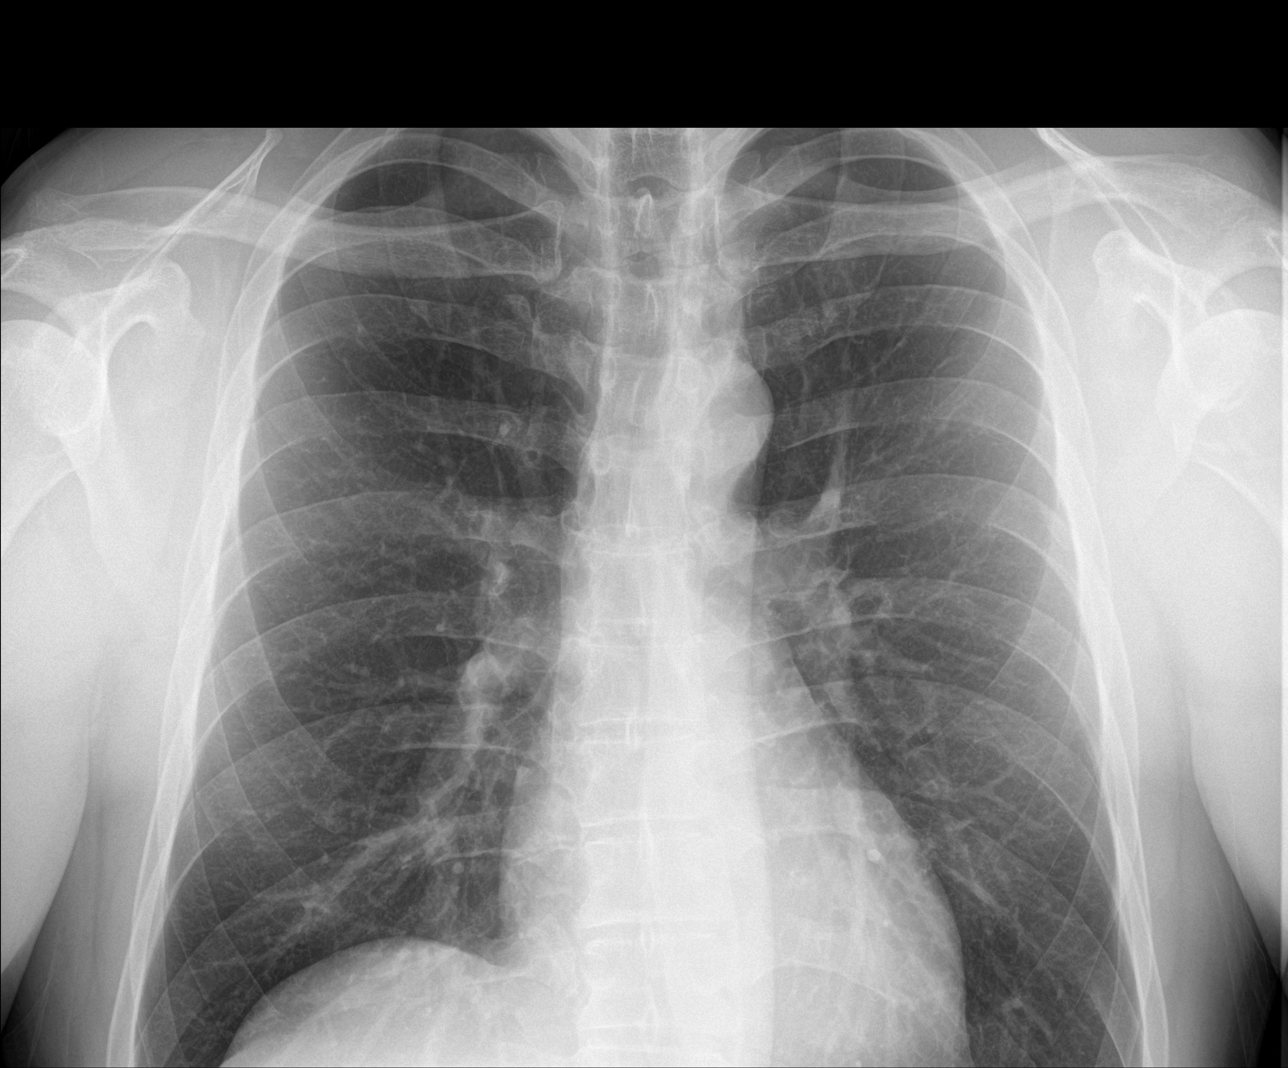

[chest lat (1 of 2)]
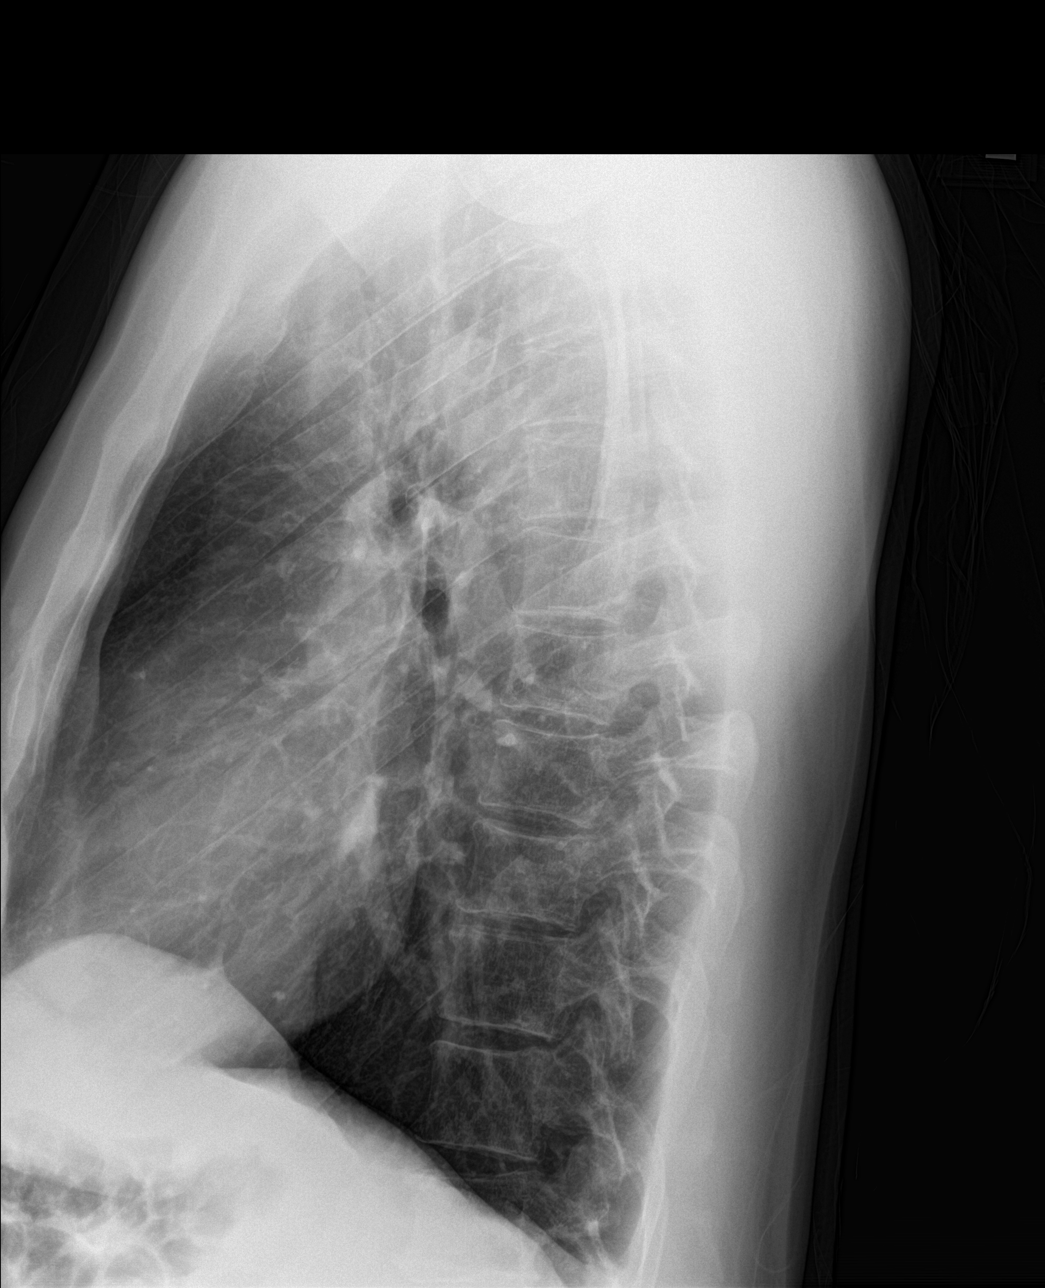

[chest pa (2 of 2)]
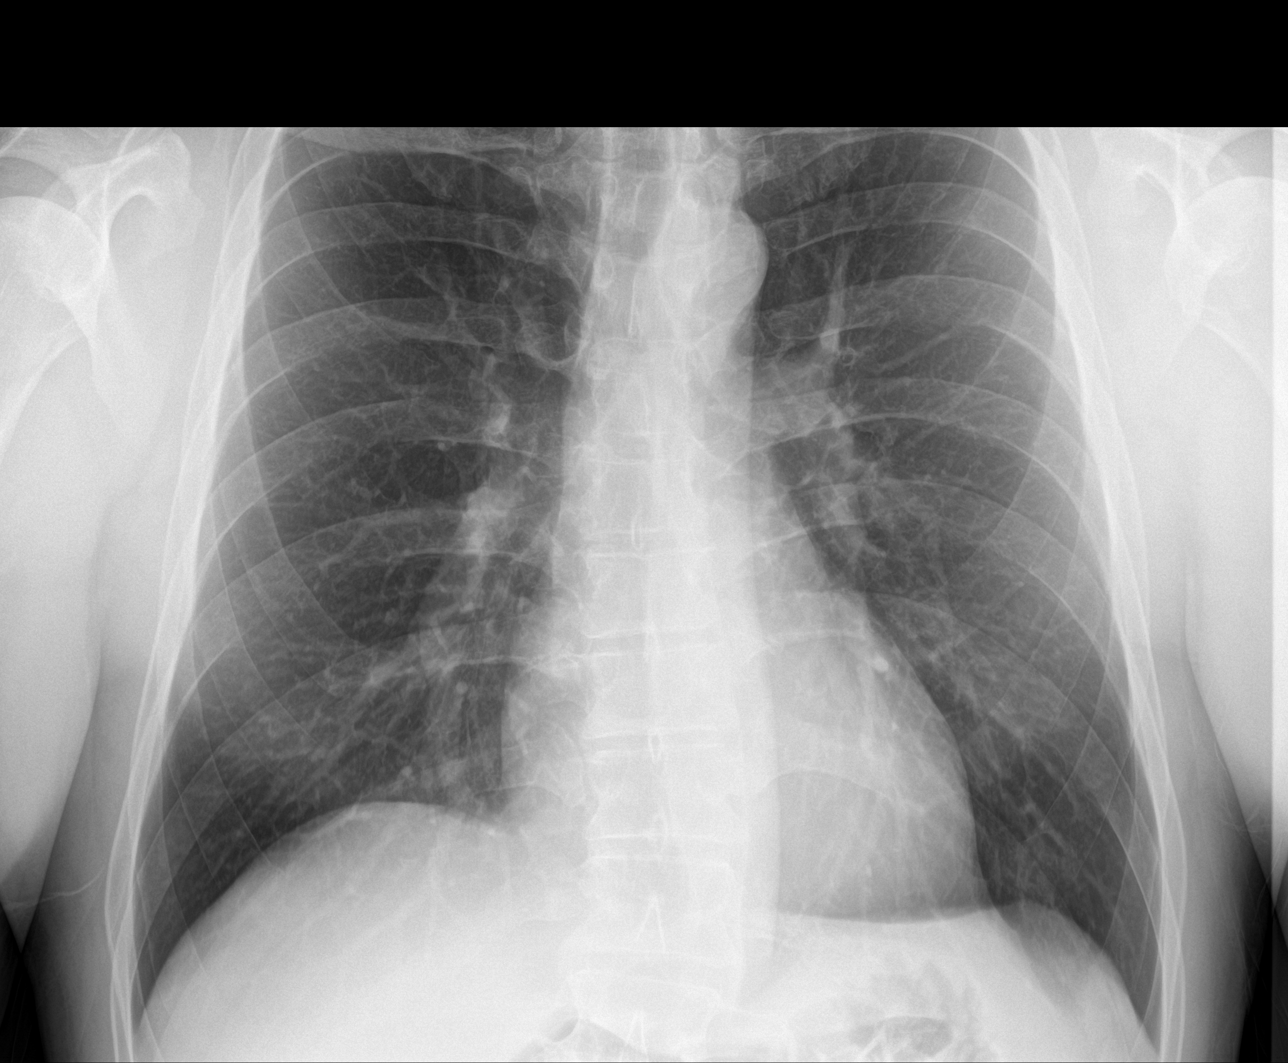

[chest lat (2 of 2)]
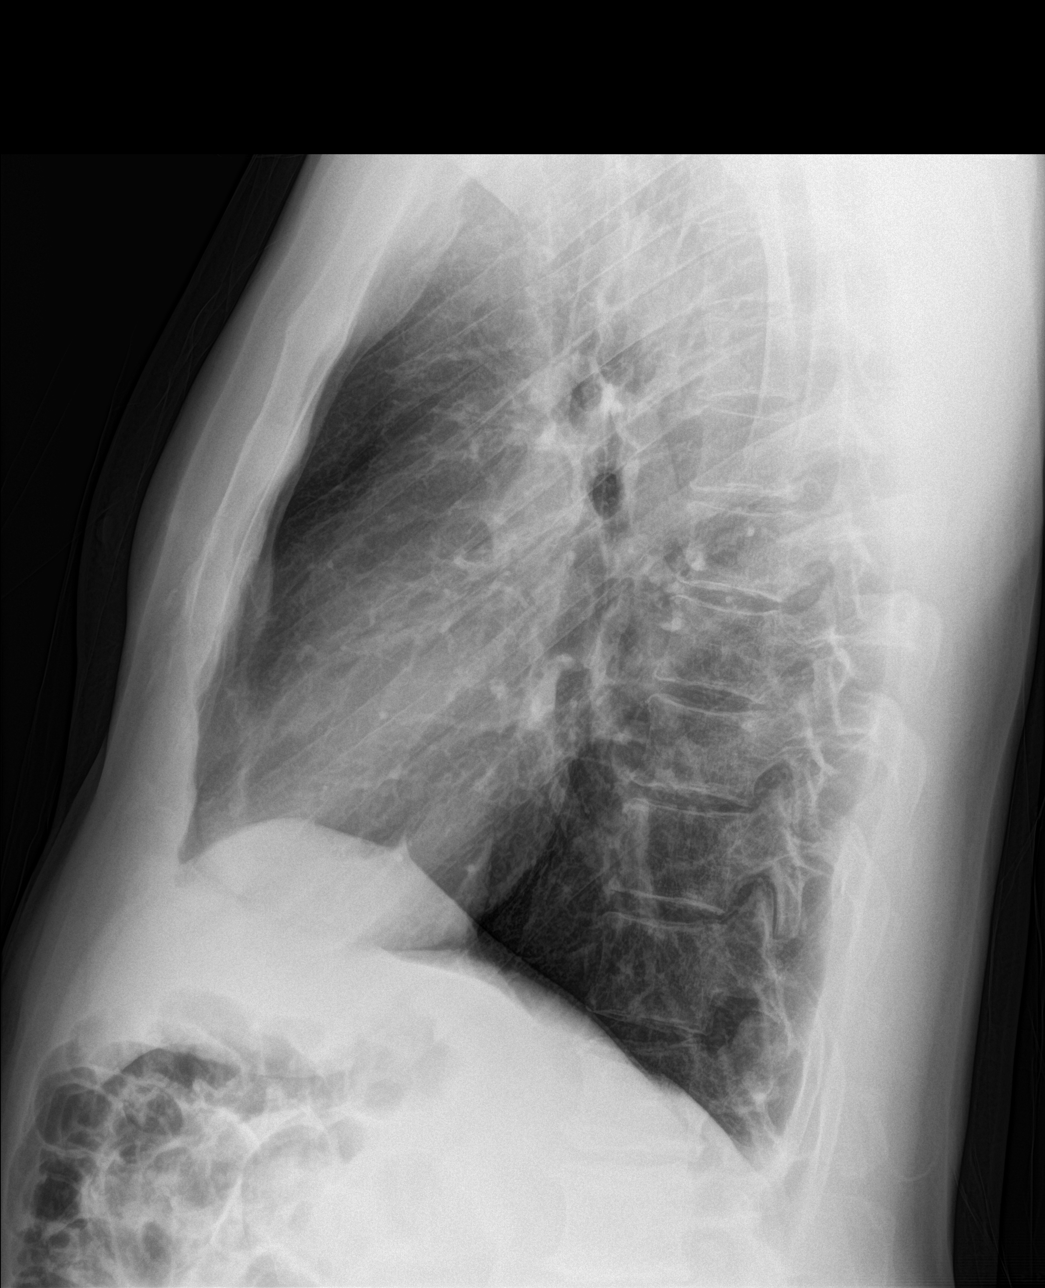

[4 of 4 positions shown; findings below may reference images not displayed]

FINDINGS: The heart size and mediastinal contours are within normal limits.
Both lungs are clear. No pleural effusion or pneumothorax. The
visualized skeletal structures are unremarkable.
IMPRESSION: No active cardiopulmonary disease.

## 2021-09-10 DIAGNOSIS — Z9889 Other specified postprocedural states: Secondary | ICD-10-CM | POA: Diagnosis not present

## 2021-09-10 DIAGNOSIS — M48061 Spinal stenosis, lumbar region without neurogenic claudication: Secondary | ICD-10-CM | POA: Diagnosis not present

## 2021-09-10 DIAGNOSIS — M5416 Radiculopathy, lumbar region: Secondary | ICD-10-CM | POA: Diagnosis not present

## 2021-11-04 ENCOUNTER — Telehealth (INDEPENDENT_AMBULATORY_CARE_PROVIDER_SITE_OTHER): Payer: BC Managed Care – PPO | Admitting: Family Medicine

## 2021-11-04 ENCOUNTER — Other Ambulatory Visit: Payer: Self-pay

## 2021-11-04 ENCOUNTER — Encounter: Payer: Self-pay | Admitting: Family Medicine

## 2021-11-04 VITALS — Wt 218.0 lb

## 2021-11-04 DIAGNOSIS — J329 Chronic sinusitis, unspecified: Secondary | ICD-10-CM | POA: Diagnosis not present

## 2021-11-04 DIAGNOSIS — J321 Chronic frontal sinusitis: Secondary | ICD-10-CM

## 2021-11-04 MED ORDER — IPRATROPIUM BROMIDE 0.06 % NA SOLN: 2.0000 | Freq: Four times a day (QID) | NASAL | 11 refills | Status: AC

## 2021-11-04 MED ORDER — FLUTICASONE PROPIONATE 50 MCG/ACT NA SUSP: 2.0000 | Freq: Every day | NASAL | 11 refills | Status: AC

## 2021-11-04 NOTE — Patient Instructions (Addendum)
   Please schedule a Follow-up Appointment to: Return if symptoms worsen or fail to improve.  If you have any other questions or concerns, please feel free to call the office or send a message through MyChart. You may also schedule an earlier appointment if necessary.  Additionally, you may be receiving a survey about your experience at our office within a few days to 1 week by e-mail or mail. We value your feedback.  Adler Chartrand, DO [facility], [facility] 

## 2021-11-04 NOTE — Progress Notes (Signed)
Virtual Visit via Telephone The purpose of this virtual visit is to provide medical care while limiting exposure to the novel coronavirus (COVID19) for both patient and office staff.  Consent was obtained for phone visit:  Yes.   Answered questions that patient had about telehealth interaction:  Yes.   I discussed the limitations, risks, security and privacy concerns of performing an evaluation and management service by telephone. I also discussed with the patient that there may be a patient responsible charge related to this service. The patient expressed understanding and agreed to proceed.  Patient Location: Work Provider Location: [employer] (Office)  Participants in virtual visit: - Patient: Nathan Wade - CMA: Orinda Kenner, CMA - Provider: Dr Parks Ranger  ---------------------------------------------------------------------- Chief Complaint  Patient presents with   Sinusitis    S: Reviewed CMA documentation. I have called patient and gathered additional HPI as follows:  Chronic Sinusitis He is due for refills on Atrovent, Flonase, reports sinus congestion chronic problem. Denies worsening sinus pain drainage headache fever chills  Denies any known or suspected exposure to person with or possibly with COVID19.  Denies any fevers, chills, sweats, body ache, cough, shortness of breath, abdominal pain, diarrhea  Past Medical History:  Diagnosis Date   Hypertension    Social History   Tobacco Use   Smoking status: Former    Packs/day: 0.50    Years: 4.00    Pack years: 2.00    Types: Cigarettes    Quit date: 2012    Years since quitting: 11.1   Smokeless tobacco: Never  Vaping Use   Vaping Use: Never used  Substance Use Topics   Alcohol use: Yes   Drug use: Not Currently    Current Outpatient Medications:    fluticasone (FLONASE) 50 MCG/ACT nasal spray, Place 2 sprays into both nostrils daily. Use for 4-6 weeks then stop and use seasonally  or as needed., Disp: 16 g, Rfl: 11   amLODipine (NORVASC) 10 MG tablet, Take 1 tablet (10 mg total) by mouth daily., Disp: 90 tablet, Rfl: 3   atorvastatin (LIPITOR) 10 MG tablet, Take 1 tablet (10 mg total) by mouth at bedtime., Disp: 90 tablet, Rfl: 3   CREATINE PO, Take 1 Scoop by mouth daily., Disp: , Rfl:    ibuprofen (ADVIL) 800 MG tablet, Take 1 tablet (800 mg total) by mouth every 8 (eight) hours as needed., Disp: 30 tablet, Rfl: 0   ipratropium (ATROVENT) 0.06 % nasal spray, Place 2 sprays into both nostrils 4 (four) times daily., Disp: 15 mL, Rfl: 11   Multiple Vitamin (MULTIVITAMIN WITH MINERALS) TABS tablet, Take 1 tablet by mouth daily., Disp: , Rfl:    sildenafil (REVATIO) 20 MG tablet, Take 1-5 tablets (20-100 mg total) by mouth daily as needed (30 mins prior to intercourse). Do not take with Tadalafil, Disp: 100 tablet, Rfl: 3   Sod Picosulfate-Mag Ox-Cit Acd (CLENPIQ) 10-3.5-12 MG-GM -GM/160ML SOLN, Take 320 mLs by mouth as directed., Disp: 320 mL, Rfl: 0   tadalafil (CIALIS) 20 MG tablet, Take 1 tablet (20 mg total) by mouth every other day as needed for erectile dysfunction. Do not take with sildenafil., Disp: 30 tablet, Rfl: 3   triamterene-hydrochlorothiazide (MAXZIDE-25) 37.5-25 MG tablet, Take 1 tablet by mouth daily., Disp: 90 tablet, Rfl: 3  Depression screen [facility] 2/9 04/14/2021 02/22/2020 02/01/2020  Decreased Interest 0 0 0  Down, Depressed, Hopeless 0 0 0  PHQ - 2 Score 0 0 0  Altered sleeping 0 - -  Tired, decreased energy 0 - -  Change in appetite 0 - -  Feeling bad or failure about yourself  0 - -  Trouble concentrating 0 - -  Moving slowly or fidgety/restless 0 - -  Suicidal thoughts 0 - -  PHQ-9 Score 0 - -  Difficult doing work/chores Not difficult at all - -    No flowsheet data found.  -------------------------------------------------------------------------- O: No physical exam performed due to remote telephone encounter.  Lab results reviewed.  No  results found for this or any previous visit (from the past 2160 hour(s)).  -------------------------------------------------------------------------- A&P:  Problem List Items Addressed This Visit   None Visit Diagnoses     Chronic frontal sinusitis    -  Primary   Relevant Medications   ipratropium (ATROVENT) 0.06 % nasal spray   fluticasone (FLONASE) 50 MCG/ACT nasal spray   Recurrent sinusitis       Relevant Medications   ipratropium (ATROVENT) 0.06 % nasal spray   fluticasone (FLONASE) 50 MCG/ACT nasal spray      Stable chronic problem w/ sinusitis persistent  Re order Atrovent and Flonase  Follow up as planned   Meds ordered this encounter  Medications   ipratropium (ATROVENT) 0.06 % nasal spray    Sig: Place 2 sprays into both nostrils 4 (four) times daily.    Dispense:  15 mL    Refill:  11   fluticasone (FLONASE) 50 MCG/ACT nasal spray    Sig: Place 2 sprays into both nostrils daily. Use for 4-6 weeks then stop and use seasonally or as needed.    Dispense:  16 g    Refill:  11    Follow-up: - Return in when ready for annual + lab  Patient verbalizes understanding with the above medical recommendations including the limitation of remote medical advice.  Specific follow-up and call-back criteria were given for patient to follow-up or seek medical care more urgently if needed.   - Time spent in direct consultation with patient on phone: 6 minutes   Nobie Putnam, [facility] 11/04/2021, 4:41 PM

## 2022-01-05 ENCOUNTER — Ambulatory Visit (INDEPENDENT_AMBULATORY_CARE_PROVIDER_SITE_OTHER): Payer: BC Managed Care – PPO | Admitting: Family Medicine

## 2022-01-05 ENCOUNTER — Encounter: Payer: Self-pay | Admitting: Family Medicine

## 2022-01-05 VITALS — Wt 218.0 lb

## 2022-01-05 DIAGNOSIS — Z91199 Patient's noncompliance with other medical treatment and regimen due to unspecified reason: Secondary | ICD-10-CM

## 2022-01-05 NOTE — Progress Notes (Signed)
Unable to reach patient attempted to call 3 x since 418pm today, soonest I was available for this virtual visit. Straight to voicemail ? ?Nobie Putnam, DO ?[facility] ?[facility] ?01/05/2022, 5:08 PM ? ?

## 2022-01-06 ENCOUNTER — Encounter: Payer: Self-pay | Admitting: Family Medicine

## 2022-01-06 ENCOUNTER — Ambulatory Visit (INDEPENDENT_AMBULATORY_CARE_PROVIDER_SITE_OTHER): Payer: BC Managed Care – PPO | Admitting: Family Medicine

## 2022-01-06 ENCOUNTER — Other Ambulatory Visit: Payer: Self-pay | Admitting: Family Medicine

## 2022-01-06 VITALS — BP 144/92 | HR 70 | Ht 73.0 in | Wt 204.0 lb

## 2022-01-06 DIAGNOSIS — I10 Essential (primary) hypertension: Secondary | ICD-10-CM

## 2022-01-06 DIAGNOSIS — D171 Benign lipomatous neoplasm of skin and subcutaneous tissue of trunk: Secondary | ICD-10-CM

## 2022-01-06 DIAGNOSIS — R197 Diarrhea, unspecified: Secondary | ICD-10-CM | POA: Diagnosis not present

## 2022-01-06 DIAGNOSIS — E7800 Pure hypercholesterolemia, unspecified: Secondary | ICD-10-CM

## 2022-01-06 DIAGNOSIS — R1031 Right lower quadrant pain: Secondary | ICD-10-CM

## 2022-01-06 DIAGNOSIS — Z0000 Encounter for general adult medical examination without abnormal findings: Secondary | ICD-10-CM

## 2022-01-06 DIAGNOSIS — Z8619 Personal history of other infectious and parasitic diseases: Secondary | ICD-10-CM

## 2022-01-06 DIAGNOSIS — Z125 Encounter for screening for malignant neoplasm of prostate: Secondary | ICD-10-CM

## 2022-01-06 DIAGNOSIS — R7309 Other abnormal glucose: Secondary | ICD-10-CM

## 2022-01-06 MED ORDER — DICYCLOMINE HCL 10 MG PO CAPS: 10.0000 mg | ORAL_CAPSULE | Freq: Three times a day (TID) | ORAL | 0 refills | Status: AC | PRN

## 2022-01-06 NOTE — Progress Notes (Signed)
? ?Subjective:  ? ? Patient ID: Nathan Wade, male    DOB: Feb 27, 1971, 51 y.o.   MRN: [id number] ? ?Nathan Wade is a 51 y.o. male presenting on 01/06/2022 for Abdominal Pain and Diarrhea ? ? ?HPI ? ? ?Abdominal Pain ?Diarrhea ?Reports onset 1.5 week ago with diarrhea and lower abdominal groin pain. Pattern is he has a few days of severe diarrhea then will have a few days without. Describes mostly runny stool now when he has it.  ? ?He did have a viral stomach bug gastro 3+ weeks ago. He was very sick with this. ? ?Missed 3 days in past 1.5 week, and missed last 2 days due to symptoms. ?He said symptoms woke him up last night ? ?Describes a R lower groin pelvic region below abdomen, and seems to be moderate pain episodic, not constant, feels more like a deeper aching pain. Not associated with cramping of bowels, and not only associated with diarrhea. ? ?Also has an abdominal wall Lipoma. ? ?PMH hernia repair in 2003. Now more recent history in 06/2020 with inguinal hernia Left sided repair surgical. ? ?Increased gas production. Not as much heartburn. Does eat some later meals. ? ?Elevated BP today 144/92 ? ?Denies nausea vomiting, fevers chills. ? ? ? ?  01/06/2022  ?  2:54 PM 01/05/2022  ?  4:07 PM 04/14/2021  ?  1:30 PM  ?Depression screen PHQ 2/9  ?Decreased Interest 0 0 0  ?Down, Depressed, Hopeless 0 0 0  ?PHQ - 2 Score 0 0 0  ?Altered sleeping 0 0 0  ?Tired, decreased energy 1 0 0  ?Change in appetite 0 0 0  ?Feeling bad or failure about yourself  0 0 0  ?Trouble concentrating 0 0 0  ?Moving slowly or fidgety/restless 0 0 0  ?Suicidal thoughts 0 0 0  ?PHQ-9 Score 1 0 0  ?Difficult doing work/chores Not difficult at all Not difficult at all Not difficult at all  ? ? ?Social History  ? ?Tobacco Use  ? Smoking status: Former  ?  Packs/day: 0.50  ?  Years: 4.00  ?  Pack years: 2.00  ?  Types: Cigarettes  ?  Quit date: 2012  ?  Years since quitting: 11.3  ? Smokeless tobacco: Never  ?Vaping Use  ? Vaping Use: Never used   ?Substance Use Topics  ? Alcohol use: Yes  ? Drug use: Not Currently  ? ? ?Review of Systems  ?Constitutional:  Negative for activity change, appetite change, chills, diaphoresis, fatigue and fever.  ?HENT:  Negative for congestion and hearing loss.   ?Eyes:  Negative for visual disturbance.  ?Respiratory:  Negative for cough, chest tightness, shortness of breath and wheezing.   ?Cardiovascular:  Negative for chest pain, palpitations and leg swelling.  ?Gastrointestinal:  Positive for abdominal pain and diarrhea. Negative for blood in stool, constipation, nausea and vomiting.  ?Genitourinary:  Negative for dysuria, frequency and hematuria.  ?Musculoskeletal:  Negative for arthralgias and neck pain.  ?Skin:  Negative for rash.  ?Neurological:  Negative for dizziness, weakness, light-headedness, numbness and headaches.  ?Hematological:  Negative for adenopathy.  ?Psychiatric/Behavioral:  Negative for behavioral problems, dysphoric mood and sleep disturbance.   ?Per HPI unless specifically indicated above ? ?   ?Objective:  ?  ?BP (!) 144/92   Pulse 70   Ht '6\' 1"'$  (1.854 m)   Wt 204 lb (92.5 kg)   SpO2 99%   BMI 26.91 kg/m?   ?Wt Readings from  Last 3 Encounters:  ?01/06/22 204 lb (92.5 kg)  ?01/05/22 218 lb (98.9 kg)  ?11/04/21 218 lb (98.9 kg)  ?  ?Physical Exam ?Vitals and nursing note reviewed.  ?Constitutional:   ?   General: He is not in acute distress. ?   Appearance: He is well-developed. He is not diaphoretic.  ?   Comments: Well-appearing, comfortable, cooperative  ?HENT:  ?   Head: Normocephalic and atraumatic.  ?Eyes:  ?   General:     ?   Right eye: No discharge.     ?   Left eye: No discharge.  ?   Conjunctiva/sclera: Conjunctivae normal.  ?Neck:  ?   Thyroid: No thyromegaly.  ?Cardiovascular:  ?   Rate and Rhythm: Normal rate and regular rhythm.  ?   Pulses: Normal pulses.  ?   Heart sounds: Normal heart sounds. No murmur heard. ?Pulmonary:  ?   Effort: Pulmonary effort is normal. No respiratory  distress.  ?   Breath sounds: Normal breath sounds. No wheezing or rales.  ?Abdominal:  ?   General: There is no distension.  ?   Palpations: Abdomen is soft.  ?   Tenderness: There is abdominal tenderness (pain is near groin region R >L) in the suprapubic area. There is no guarding or rebound. Negative signs include Murphy's sign and McBurney's sign.  ?   Hernia: No hernia (No reproduced hernia on lower abdomen R or L inguinal. no abdominal hernia) is present. There is no hernia in the umbilical area, ventral area, left inguinal area, right femoral area, left femoral area or right inguinal area.  ?Musculoskeletal:     ?   General: Normal range of motion.  ?   Cervical back: Normal range of motion and neck supple.  ?Lymphadenopathy:  ?   Cervical: No cervical adenopathy.  ?Skin: ?   General: Skin is warm and dry.  ?   Findings: No erythema or rash.  ?   Comments: Localized 2-3 cm lipoma consistent with previous exam R mid abdomen  ?Neurological:  ?   Mental Status: He is alert and oriented to person, place, and time. Mental status is at baseline.  ?Psychiatric:     ?   Behavior: Behavior normal.  ?   Comments: Well groomed, good eye contact, normal speech and thoughts  ? ? ? ?Results for orders placed or performed in visit on 03/09/21  ?Lipid panel  ?Result Value Ref Range  ? Cholesterol 159 <200 mg/dL  ? HDL 74 > OR = 40 mg/dL  ? Triglycerides 84 <150 mg/dL  ? LDL Cholesterol (Calc) 69 mg/dL (calc)  ? Total CHOL/HDL Ratio 2.1 <5.0 (calc)  ? Non-HDL Cholesterol (Calc) 85 <130 mg/dL (calc)  ?HIV Antibody (routine testing w rflx)  ?Result Value Ref Range  ? HIV 1&2 Ab, 4th Generation NON-REACTIVE NON-REACTIVE  ?Hepatitis C antibody  ?Result Value Ref Range  ? Hepatitis C Ab REACTIVE (A) NON-REACTIVE  ? SIGNAL TO CUT-OFF 23.90 (H) <1.00  ?PSA  ?Result Value Ref Range  ? PSA 1.02 < OR = 4.00 ng/mL  ?COMPLETE METABOLIC PANEL WITH GFR  ?Result Value Ref Range  ? Glucose, Bld 87 65 - 99 mg/dL  ? BUN 17 7 - 25 mg/dL  ?  Creat 1.05 0.70 - 1.33 mg/dL  ? GFR, Est Non African American 82 > OR = 60 mL/min/1.54m  ? GFR, Est African American 95 > OR = 60 mL/min/1.734m ? BUN/Creatinine Ratio NOT APPLICABLE 6 - 22 (  calc)  ? Sodium 135 135 - 146 mmol/L  ? Potassium 4.3 3.5 - 5.3 mmol/L  ? Chloride 97 (L) 98 - 110 mmol/L  ? CO2 30 20 - 32 mmol/L  ? Calcium 9.6 8.6 - 10.3 mg/dL  ? Total Protein 7.1 6.1 - 8.1 g/dL  ? Albumin 4.5 3.6 - 5.1 g/dL  ? Globulin 2.6 1.9 - 3.7 g/dL (calc)  ? AG Ratio 1.7 1.0 - 2.5 (calc)  ? Total Bilirubin 0.4 0.2 - 1.2 mg/dL  ? Alkaline phosphatase (APISO) 47 35 - 144 U/L  ? AST 16 10 - 35 U/L  ? ALT 17 9 - 46 U/L  ?CBC with Differential/Platelet  ?Result Value Ref Range  ? WBC 5.5 3.8 - 10.8 Thousand/uL  ? RBC 4.81 4.20 - 5.80 Million/uL  ? Hemoglobin 15.0 13.2 - 17.1 g/dL  ? HCT 44.3 38.5 - 50.0 %  ? MCV 92.1 80.0 - 100.0 fL  ? MCH 31.2 27.0 - 33.0 pg  ? MCHC 33.9 32.0 - 36.0 g/dL  ? RDW 12.8 11.0 - 15.0 %  ? Platelets 282 140 - 400 Thousand/uL  ? MPV 8.6 7.5 - 12.5 fL  ? Neutro Abs 3,333 1,500 - 7,800 cells/uL  ? Lymphs Abs 1,293 850 - 3,900 cells/uL  ? Absolute Monocytes 446 200 - 950 cells/uL  ? Eosinophils Absolute 369 15 - 500 cells/uL  ? Basophils Absolute 61 0 - 200 cells/uL  ? Neutrophils Relative % 60.6 %  ? Total Lymphocyte 23.5 %  ? Monocytes Relative 8.1 %  ? Eosinophils Relative 6.7 %  ? Basophils Relative 1.1 %  ?Hemoglobin A1c  ?Result Value Ref Range  ? Hgb A1c MFr Bld 5.2 <5.7 % of total Hgb  ? Mean Plasma Glucose 103 mg/dL  ? eAG (mmol/L) 5.7 mmol/L  ?HCV RNA, Quantitative Real Time PCR  ?Result Value Ref Range  ? HCV RNA, PCR, QN <15 NOT DETECTED NOT DETECTED IU/mL  ? HCV Quantitative Log <1.18 NOT DETECTED NOT DETECTED Log IU/mL  ? ?   ?Assessment & Plan:  ? ?Problem List Items Addressed This Visit   ?None ?Visit Diagnoses   ? ? Diarrhea, unspecified type    -  Primary  ? Relevant Medications  ? dicyclomine (BENTYL) 10 MG capsule  ? Other Relevant Orders  ? DG Abd 1 View  ? Right lower quadrant  abdominal pain      ? Relevant Orders  ? DG Abd 1 View  ? ?  ?  ?Constellation of GI symptoms ?History most suggestive of functional diarrhea abdominal pain following recent severe viral gastroenteritis ?Conside

## 2022-01-06 NOTE — Patient Instructions (Addendum)
Thank you for coming to the office today. ? ?OTC Peppermint Oil (Triple Coated Capsule) '180mg'$  take one 3 times daily to reduce diarrhea ? ?Dicyclomine (Bentyl) as needed for abdominal pain ? ?X-ray of abdomen if not improved, walk in first come first serve, Mon Thurs 8am to 415pm aprox ? ?Other more natural remedies or preventative treatment: ?- Increase hydration with water ?- Increase fiber in diet (high fiber foods = vegetables, leafy greens, oats/grains) ?- May take OTC Fiber supplement (metamucil powder or pill/gummy) ?- May try OTC Probiotic ? ?DUE for FASTING BLOOD WORK (no food or drink after midnight before the lab appointment, only water or coffee without cream/sugar on the morning of) ? ?SCHEDULE "Lab Only" visit in the morning at the clinic for lab draw in 3 MONTHS  ? ?- Make sure Lab Only appointment is at about 1 week before your next appointment, so that results will be available ? ?For Lab Results, once available within 2-3 days of blood draw, you can can log in to MyChart online to view your results and a brief explanation. Also, we can discuss results at next follow-up visit. ? ? ?Please schedule a Follow-up Appointment to: Return in about 3 months (around 04/07/2022) for 3 month fasting lab only then 1 week later Annual Physical. ? ?If you have any other questions or concerns, please feel free to call the office or send a message through [place]. You may also schedule an earlier appointment if necessary. ? ?Additionally, you may be receiving a survey about your experience at our office within a few days to 1 week by e-mail or mail. We value your feedback. ? ?Nobie Putnam, DO ?[place] ?

## 2022-04-14 ENCOUNTER — Encounter: Payer: Self-pay | Admitting: Family Medicine

## 2022-04-14 ENCOUNTER — Ambulatory Visit: Payer: Self-pay | Admitting: *Deleted

## 2022-04-14 ENCOUNTER — Other Ambulatory Visit: Payer: Self-pay | Admitting: Family Medicine

## 2022-04-14 ENCOUNTER — Ambulatory Visit (INDEPENDENT_AMBULATORY_CARE_PROVIDER_SITE_OTHER): Payer: BC Managed Care – PPO | Admitting: Family Medicine

## 2022-04-14 VITALS — Ht 73.0 in | Wt 204.0 lb

## 2022-04-14 DIAGNOSIS — Z0000 Encounter for general adult medical examination without abnormal findings: Secondary | ICD-10-CM

## 2022-04-14 DIAGNOSIS — J0110 Acute frontal sinusitis, unspecified: Secondary | ICD-10-CM

## 2022-04-14 DIAGNOSIS — I10 Essential (primary) hypertension: Secondary | ICD-10-CM

## 2022-04-14 DIAGNOSIS — Z8619 Personal history of other infectious and parasitic diseases: Secondary | ICD-10-CM

## 2022-04-14 DIAGNOSIS — R7309 Other abnormal glucose: Secondary | ICD-10-CM

## 2022-04-14 DIAGNOSIS — E7800 Pure hypercholesterolemia, unspecified: Secondary | ICD-10-CM

## 2022-04-14 DIAGNOSIS — Z125 Encounter for screening for malignant neoplasm of prostate: Secondary | ICD-10-CM

## 2022-04-14 MED ORDER — AMOXICILLIN-POT CLAVULANATE 875-125 MG PO TABS: 1.0000 | ORAL_TABLET | Freq: Two times a day (BID) | ORAL | 0 refills | Status: AC

## 2022-04-14 MED ORDER — PREDNISONE 20 MG PO TABS: ORAL_TABLET | ORAL | 0 refills | Status: AC

## 2022-04-14 NOTE — Telephone Encounter (Signed)
Had a patient same day canceled and call pt and completed a virtual appt

## 2022-04-14 NOTE — Telephone Encounter (Signed)
Reason for Disposition  [1] MODERATE weakness (i.e., interferes with work, school, normal activities) AND [2] cause unknown  (Exceptions: Weakness from acute minor illness or poor fluid intake; weakness is chronic and not worse.)  Answer Assessment - Initial Assessment Questions 1. DESCRIPTION: "Describe how you are feeling."     Weak, not feeling himself, sneezing, cough,joint pain 2. SEVERITY: "How bad is it?"  "Can you stand and walk?"   - MILD (0-3): Feels weak or tired, but does not interfere with work, school or normal activities.   - MODERATE (4-7): Able to stand and walk; weakness interferes with work, school, or normal activities.   - SEVERE (8-10): Unable to stand or walk; unable to do usual activities.     mild 3. ONSET: "When did these symptoms begin?" (e.g., hours, days, weeks, months)     1 week 4. CAUSE: "What do you think is causing the weakness or fatigue?" (e.g., not drinking enough fluids, medical problem, trouble sleeping)     unsure 5. NEW MEDICINES:  "Have you started on any new medicines recently?" (e.g., opioid pain medicines, benzodiazepines, muscle relaxants, antidepressants, antihistamines, neuroleptics, beta blockers)     *No Answer* 6. OTHER SYMPTOMS: "Do you have any other symptoms?" (e.g., chest pain, fever, cough, SOB, vomiting, diarrhea, bleeding, other areas of pain)     Cough, sore throat 7. PREGNANCY: "Is there any chance you are pregnant?" "When was your last menstrual period?"     *No Answer*  Protocols used: Weakness (Generalized) and Fatigue-A-AH

## 2022-04-14 NOTE — Progress Notes (Signed)
Virtual Visit via Telephone The purpose of this virtual visit is to provide medical care while limiting exposure to the novel coronavirus (COVID19) for both patient and office staff.  Consent was obtained for phone visit:  Yes.   Answered questions that patient had about telehealth interaction:  Yes.   I discussed the limitations, risks, security and privacy concerns of performing an evaluation and management service by telephone. I also discussed with the patient that there may be a patient responsible charge related to this service. The patient expressed understanding and agreed to proceed.  Patient Location: Home Provider Location: [facility] (Office)  Participants in virtual visit: - Patient: Nathan Wade - CMA: Orinda Kenner, [place] - Provider: Dr Parks Ranger  ---------------------------------------------------------------------- Chief Complaint  Patient presents with   Cough   Sore Throat    S: Reviewed CMA documentation. I have called patient and gathered additional HPI as follows:  He has relocated to [facility]  Viral Syndrome Reports that symptoms started with 7 days feeling bad, fatigue, aching, flu-like, fevers chills past few days with sinus congestion. No known sick contacts. No known COVID exposure. Admits sinus pressure congestion  Using rx Atrovent / Flonase PRN. Admits some shortness of breath.  Denies any known or suspected exposure to person with or possibly with COVID19.  Denies any abdominal pain, diarrhea  Past Medical History:  Diagnosis Date   Hypertension    Social History   Tobacco Use   Smoking status: Former    Packs/day: 0.50    Years: 4.00    Total pack years: 2.00    Types: Cigarettes    Quit date: 2012    Years since quitting: 11.5   Smokeless tobacco: Never  Vaping Use   Vaping Use: Never used  Substance Use Topics   Alcohol use: Yes   Drug use: Not Currently    Current Outpatient Medications:    amLODipine  (NORVASC) 10 MG tablet, Take 1 tablet (10 mg total) by mouth daily., Disp: 90 tablet, Rfl: 3   amoxicillin-clavulanate (AUGMENTIN) 875-125 MG tablet, Take 1 tablet by mouth 2 (two) times daily., Disp: 20 tablet, Rfl: 0   atorvastatin (LIPITOR) 10 MG tablet, Take 1 tablet (10 mg total) by mouth at bedtime., Disp: 90 tablet, Rfl: 3   CREATINE PO, Take 1 Scoop by mouth daily., Disp: , Rfl:    dicyclomine (BENTYL) 10 MG capsule, Take 1 capsule (10 mg total) by mouth 3 (three) times daily as needed for spasms (abdominal pain)., Disp: 30 capsule, Rfl: 0   fluticasone (FLONASE) 50 MCG/ACT nasal spray, Place 2 sprays into both nostrils daily. Use for 4-6 weeks then stop and use seasonally or as needed., Disp: 16 g, Rfl: 11   ibuprofen (ADVIL) 800 MG tablet, Take 1 tablet (800 mg total) by mouth every 8 (eight) hours as needed., Disp: 30 tablet, Rfl: 0   ipratropium (ATROVENT) 0.06 % nasal spray, Place 2 sprays into both nostrils 4 (four) times daily., Disp: 15 mL, Rfl: 11   Multiple Vitamin (MULTIVITAMIN WITH MINERALS) TABS tablet, Take 1 tablet by mouth daily., Disp: , Rfl:    predniSONE (DELTASONE) 20 MG tablet, Take daily with food. Start with '60mg'$  (3 pills) x 2 days, then reduce to '40mg'$  (2 pills) x 2 days, then '20mg'$  (1 pill) x 3 days, Disp: 13 tablet, Rfl: 0   sildenafil (REVATIO) 20 MG tablet, Take 1-5 tablets (20-100 mg total) by mouth daily as needed (30 mins prior to intercourse). Do not  take with Tadalafil, Disp: 100 tablet, Rfl: 3   Sod Picosulfate-Mag Ox-Cit Acd (CLENPIQ) 10-3.5-12 MG-GM -GM/160ML SOLN, Take 320 mLs by mouth as directed., Disp: 320 mL, Rfl: 0   tadalafil (CIALIS) 20 MG tablet, Take 1 tablet (20 mg total) by mouth every other day as needed for erectile dysfunction. Do not take with sildenafil., Disp: 30 tablet, Rfl: 3   triamterene-hydrochlorothiazide (MAXZIDE-25) 37.5-25 MG tablet, Take 1 tablet by mouth daily., Disp: 90 tablet, Rfl: 3     01/06/2022    2:54 PM 01/05/2022    4:07  PM 04/14/2021    1:30 PM  Depression screen PHQ 2/9  Decreased Interest 0 0 0  Down, Depressed, Hopeless 0 0 0  PHQ - 2 Score 0 0 0  Altered sleeping 0 0 0  Tired, decreased energy 1 0 0  Change in appetite 0 0 0  Feeling bad or failure about yourself  0 0 0  Trouble concentrating 0 0 0  Moving slowly or fidgety/restless 0 0 0  Suicidal thoughts 0 0 0  PHQ-9 Score 1 0 0  Difficult doing work/chores Not difficult at all Not difficult at all Not difficult at all       01/06/2022    2:55 PM 01/05/2022    4:08 PM  GAD 7 : Generalized Anxiety Score  Nervous, Anxious, on Edge 2 0  Control/stop worrying 0 0  Worry too much - different things 2 0  Trouble relaxing 0 0  Restless 0 0  Easily annoyed or irritable 0 0  Afraid - awful might happen 0 0  Total GAD 7 Score 4 0  Anxiety Difficulty Not difficult at all Not difficult at all    -------------------------------------------------------------------------- O: No physical exam performed due to remote telephone encounter.  Lab results reviewed.  No results found for this or any previous visit (from the past 2160 hour(s)).  -------------------------------------------------------------------------- A&P:  Problem List Items Addressed This Visit   None Visit Diagnoses     Acute non-recurrent frontal sinusitis    -  Primary   Relevant Medications   amoxicillin-clavulanate (AUGMENTIN) 875-125 MG tablet   predniSONE (DELTASONE) 20 MG tablet      Consistent with acute frontal rhinosinusitis, likely initially viral URI seems to have had viral constellation of symptoms that is now worsening with second sickening >1 week with sinusitis now.  Advised cannot rule out COVID or other virus, he may do at home testing if plans to pursue.  Will cover empirically for sinusitis   Plan: 1. Start Augmentin 875-'125mg'$  PO BID x 10 days 2. Prednisone taper 7 day for cough / dyspnea  - May use existing atrovent / flonase Return criteria  reviewed   Meds ordered this encounter  Medications   amoxicillin-clavulanate (AUGMENTIN) 875-125 MG tablet    Sig: Take 1 tablet by mouth 2 (two) times daily.    Dispense:  20 tablet    Refill:  0   predniSONE (DELTASONE) 20 MG tablet    Sig: Take daily with food. Start with '60mg'$  (3 pills) x 2 days, then reduce to '40mg'$  (2 pills) x 2 days, then '20mg'$  (1 pill) x 3 days    Dispense:  13 tablet    Refill:  0    Follow-up: - Return in 2 weeks + for Annual Physical ( he will go to [facility] closer to his home for labs)  Patient verbalizes understanding with the above medical recommendations including the limitation of remote medical advice.  Specific  follow-up and call-back criteria were given for patient to follow-up or seek medical care more urgently if needed.   - Time spent in direct consultation with patient on phone: 8 minutes  Nobie Putnam, [facility] 04/14/2022, 11:42 AM

## 2022-04-14 NOTE — Telephone Encounter (Signed)
  Chief Complaint: fatigue, cough, sore throat, joint pain Symptoms: see above Frequency: over 1 week- getting worse Pertinent Negatives: Patient denies   Disposition: '[]'$ ED /'[]'$ Urgent Care (no appt availability in office) / '[]'$ Appointment(In office/virtual)/ '[]'$  [place] [facility]/ '[]'$ Home Care/ '[x]'$ Refused Recommended Disposition /'[]'$ [place] [facility]/ '[]'$  Follow-up with PCP Additional Notes: Patient states he is in [place] now- requesting afternoon appointment-  call to office for possible appointment- (schedule 83%) per Caryl Pina schedule is full. Patient advised - he got upset- states he is not going to [facility]- he is upset that he can not get in to see PCP- states he is not coming back to office and he hung up. I did apologize for not being able to see him today- both provider schedules were checked.

## 2022-04-14 NOTE — Patient Instructions (Addendum)
Labs sent to [facility], any draw station can do them.  Start antibiotic and prednisone Use nasal sprays May use sudafed Avoid ibuprofen until finish prednisone Tylenol is okay  Please schedule a Follow-up Appointment to: Return in about 2 weeks (around 04/28/2022) for 2 weeks annual physical after [facility].  If you have any other questions or concerns, please feel free to call the office or send a message through [place]. You may also schedule an earlier appointment if necessary.  Additionally, you may be receiving a survey about your experience at our office within a few days to 1 week by e-mail or mail. We value your feedback.  Nobie Putnam, DO [place]

## 2022-05-07 ENCOUNTER — Other Ambulatory Visit: Payer: Self-pay | Admitting: Family Medicine

## 2022-05-07 DIAGNOSIS — I10 Essential (primary) hypertension: Secondary | ICD-10-CM

## 2022-05-07 NOTE — Telephone Encounter (Signed)
Requested medication (s) are due for refill today: yes  Requested medication (s) are on the active medication list: yes  Last refill:  04/14/21 #90/3  Future visit scheduled: no  Notes to clinic:  Unable to refill per protocol due to failed labs, no updated results.      Requested Prescriptions  Pending Prescriptions Disp Refills   triamterene-hydrochlorothiazide (MAXZIDE-25) 37.5-25 MG tablet [Pharmacy Med Name: Triamterene-HCTZ 37.5-25 MG Oral Tablet] 30 tablet 0    Sig: Take 1 tablet by mouth once daily     Cardiovascular: Diuretic Combos Failed - 05/07/2022  9:09 AM      Failed - K in normal range and within 180 days    Potassium  Date Value Ref Range Status  03/09/2021 4.3 3.5 - 5.3 mmol/L Final         Failed - Na in normal range and within 180 days    Sodium  Date Value Ref Range Status  03/09/2021 135 135 - 146 mmol/L Final         Failed - Cr in normal range and within 180 days    Creat  Date Value Ref Range Status  03/09/2021 1.05 0.70 - 1.33 mg/dL Final    Comment:    For patients >62 years of age, the reference limit for Creatinine is approximately 13% higher for people identified as African-American. .          Failed - Last BP in normal range    BP Readings from Last 1 Encounters:  01/06/22 (!) 144/92         Passed - Valid encounter within last 6 months    Recent Outpatient Visits           3 weeks ago Acute non-recurrent frontal sinusitis   [place], DO   4 months ago Diarrhea, unspecified type   [place], DO   4 months ago No-show for appointment   [facility] Olin Hauser, DO   6 months ago Chronic frontal sinusitis   [place], DO   1 year ago Annual physical exam   [place], DO              Signed Prescriptions Disp Refills    amLODipine (NORVASC) 10 MG tablet 90 tablet 0    Sig: Take 1 tablet by mouth once daily     Cardiovascular: Calcium Channel Blockers 2 Failed - 05/07/2022  9:09 AM      Failed - Last BP in normal range    BP Readings from Last 1 Encounters:  01/06/22 (!) 144/92         Passed - Last Heart Rate in normal range    Pulse Readings from Last 1 Encounters:  01/06/22 70         Passed - Valid encounter within last 6 months    Recent Outpatient Visits           3 weeks ago Acute non-recurrent frontal sinusitis   [place], DO   4 months ago Diarrhea, unspecified type   [place], DO   4 months ago No-show for appointment   [place], DO   6 months ago Chronic frontal sinusitis   [facility] [place], Devonne Doughty, DO   1 year ago  Annual physical exam   [facility] [place], Devonne Doughty, [place]

## 2022-05-07 NOTE — Telephone Encounter (Signed)
Requested Prescriptions  Pending Prescriptions Disp Refills  . amLODipine (NORVASC) 10 MG tablet [Pharmacy Med Name: amLODIPine Besylate 10 MG Oral Tablet] 30 tablet 0    Sig: Take 1 tablet by mouth once daily     Cardiovascular: Calcium Channel Blockers 2 Failed - 05/07/2022  9:09 AM      Failed - Last BP in normal range    BP Readings from Last 1 Encounters:  01/06/22 (!) 144/92         Passed - Last Heart Rate in normal range    Pulse Readings from Last 1 Encounters:  01/06/22 70         Passed - Valid encounter within last 6 months    Recent Outpatient Visits          3 weeks ago Acute non-recurrent frontal sinusitis   [place], DO   4 months ago Diarrhea, unspecified type   [place], DO   4 months ago No-show for appointment   [facility] [place], Devonne Doughty, DO   6 months ago Chronic frontal sinusitis   [facility] [place], Devonne Doughty, DO   1 year ago Annual physical exam   [facility], Devonne Doughty, DO             . triamterene-hydrochlorothiazide (MAXZIDE-25) 37.5-25 MG tablet [Pharmacy Med Name: Triamterene-HCTZ 37.5-25 MG Oral Tablet] 30 tablet 0    Sig: Take 1 tablet by mouth once daily     Cardiovascular: Diuretic Combos Failed - 05/07/2022  9:09 AM      Failed - K in normal range and within 180 days    Potassium  Date Value Ref Range Status  03/09/2021 4.3 3.5 - 5.3 mmol/L Final         Failed - Na in normal range and within 180 days    Sodium  Date Value Ref Range Status  03/09/2021 135 135 - 146 mmol/L Final         Failed - Cr in normal range and within 180 days    Creat  Date Value Ref Range Status  03/09/2021 1.05 0.70 - 1.33 mg/dL Final    Comment:    For patients >77 years of age, the reference limit for Creatinine is approximately 13% higher for people identified as  African-American. .          Failed - Last BP in normal range    BP Readings from Last 1 Encounters:  01/06/22 (!) 144/92         Passed - Valid encounter within last 6 months    Recent Outpatient Visits          3 weeks ago Acute non-recurrent frontal sinusitis   [place], DO   4 months ago Diarrhea, unspecified type   [place], DO   4 months ago No-show for appointment   [place], DO   6 months ago Chronic frontal sinusitis   [facility] Olin Hauser, DO   1 year ago Annual physical exam   [facility] Olin Hauser, DO

## 2022-06-18 ENCOUNTER — Other Ambulatory Visit: Payer: Self-pay | Admitting: Family Medicine

## 2022-06-18 DIAGNOSIS — I10 Essential (primary) hypertension: Secondary | ICD-10-CM

## 2022-06-21 NOTE — Telephone Encounter (Signed)
Requested medications are due for refill today.  yes  Requested medications are on the active medications list.  yes  Last refill. 05/07/2022 #30 0 rf  Future visit scheduled.   no  Notes to clinic.  Labs are expired.    Requested Prescriptions  Pending Prescriptions Disp Refills   triamterene-hydrochlorothiazide (MAXZIDE-25) 37.5-25 MG tablet [Pharmacy Med Name: Triamterene-HCTZ 37.5-25 MG Oral Tablet] 30 tablet 0    Sig: Take 1 tablet by mouth once daily     Cardiovascular: Diuretic Combos Failed - 06/18/2022  4:58 PM      Failed - K in normal range and within 180 days    Potassium  Date Value Ref Range Status  03/09/2021 4.3 3.5 - 5.3 mmol/L Final         Failed - Na in normal range and within 180 days    Sodium  Date Value Ref Range Status  03/09/2021 135 135 - 146 mmol/L Final         Failed - Cr in normal range and within 180 days    Creat  Date Value Ref Range Status  03/09/2021 1.05 0.70 - 1.33 mg/dL Final    Comment:    For patients >34 years of age, the reference limit for Creatinine is approximately 13% higher for people identified as African-American. .          Failed - Last BP in normal range    BP Readings from Last 1 Encounters:  01/06/22 (!) 144/92         Passed - Valid encounter within last 6 months    Recent Outpatient Visits           2 months ago Acute non-recurrent frontal sinusitis   [place], DO   5 months ago Diarrhea, unspecified type   [place], DO   5 months ago No-show for appointment   [place], DO   7 months ago Chronic frontal sinusitis   [facility] Olin Hauser, DO   1 year ago Annual physical exam   [facility] Olin Hauser, DO

## 2022-06-24 ENCOUNTER — Other Ambulatory Visit: Payer: Self-pay

## 2022-06-24 ENCOUNTER — Ambulatory Visit: Payer: Self-pay

## 2022-06-24 DIAGNOSIS — I10 Essential (primary) hypertension: Secondary | ICD-10-CM

## 2022-06-24 NOTE — Telephone Encounter (Signed)
Pt called. He is out of this medication. He is working out of town. Pt made upcoming appt. Pt would like enough medication to last until his appointment.

## 2022-06-24 NOTE — Telephone Encounter (Signed)
  Chief Complaint: Missing medication Symptoms:  Frequency:  Pertinent Negatives: Patient denies  Disposition: '[]'$ ED /'[]'$ Urgent Care (no appt availability in office) / '[]'$ Appointment(In office/virtual)/ '[]'$  [place] [facility]/ '[]'$ Home Care/ '[]'$ Refused Recommended Disposition /'[]'$ [place] [facility]/ '[x]'$  Follow-up with PCP Additional Notes: PT is out of Maxide 25 and would like a refill. PT has made appt for follow up. Refill request sent.  Reason for Disposition  [1] Prescription refill request for ESSENTIAL medicine (i.e., likelihood of harm to patient if not taken) AND [2] triager unable to refill per department policy  Answer Assessment - Initial Assessment Questions 1. DRUG NAME: "What medicine do you need to have refilled?"     Maxide 25 2. REFILLS REMAINING: "How many refills are remaining?" (Note: The label on the medicine or pill bottle will show how many refills are remaining. If there are no refills remaining, then a renewal may be needed.)     none 3. EXPIRATION DATE: "What is the expiration date?" (Note: The label states when the prescription will expire, and thus can no longer be refilled.)      4. PRESCRIBING HCP: "Who prescribed it?" Reason: If prescribed by specialist, call should be referred to that group.      5. SYMPTOMS: "Do you have any symptoms?"      6. PREGNANCY: "Is there any chance that you are pregnant?" "When was your last menstrual period?"  Protocols used: Medication Refill and Renewal Call-A-AH

## 2022-06-25 MED ORDER — TRIAMTERENE-HCTZ 37.5-25 MG PO TABS: 1.0000 | ORAL_TABLET | Freq: Every day | ORAL | 0 refills | Status: AC

## 2022-06-25 NOTE — Telephone Encounter (Signed)
Requested medication (s) are due for refill today: yes  Requested medication (s) are on the active medication list: yes  Last refill:  05/28/22  Future visit scheduled:yes  Notes to clinic:  Unable to refill per protocol, medication was refused 06/21/22 due to not being under provider's care but patient has up coming OV with Dr. Parks Ranger. Patient is still under PCP care. Routing for review.     Requested Prescriptions  Pending Prescriptions Disp Refills   triamterene-hydrochlorothiazide (MAXZIDE-25) 37.5-25 MG tablet 30 tablet 0    Sig: Take 1 tablet by mouth daily.     Cardiovascular: Diuretic Combos Failed - 06/24/2022  6:05 PM      Failed - K in normal range and within 180 days    Potassium  Date Value Ref Range Status  03/09/2021 4.3 3.5 - 5.3 mmol/L Final         Failed - Na in normal range and within 180 days    Sodium  Date Value Ref Range Status  03/09/2021 135 135 - 146 mmol/L Final         Failed - Cr in normal range and within 180 days    Creat  Date Value Ref Range Status  03/09/2021 1.05 0.70 - 1.33 mg/dL Final    Comment:    For patients >20 years of age, the reference limit for Creatinine is approximately 13% higher for people identified as African-American. .          Failed - Last BP in normal range    BP Readings from Last 1 Encounters:  01/06/22 (!) 144/92         Passed - Valid encounter within last 6 months    Recent Outpatient Visits           2 months ago Acute non-recurrent frontal sinusitis   [place], DO   5 months ago Diarrhea, unspecified type   [place], DO   5 months ago No-show for appointment   [place], DO   7 months ago Chronic frontal sinusitis   [facility] Olin Hauser, DO   1 year ago Annual physical exam   [facility] Olin Hauser, DO        Future Appointments             In 3 weeks Parks Ranger, Devonne Doughty, [facility], [facility]

## 2022-07-16 ENCOUNTER — Ambulatory Visit: Payer: BC Managed Care – PPO | Admitting: Family Medicine
